# Patient Record
Sex: Female | Born: 1990 | Race: White | Hispanic: No | Marital: Married | Smoking: Current every day smoker
Health system: Southern US, Community
[De-identification: ages and names within clinical notes are randomized; demographics above are authoritative.]

## PROBLEM LIST (undated history)

## (undated) DIAGNOSIS — F419 Anxiety disorder, unspecified: Secondary | ICD-10-CM

## (undated) HISTORY — DX: Anxiety disorder, unspecified: F41.9

## (undated) HISTORY — PX: OTHER SURGICAL HISTORY: SHX169

---

## 2017-01-02 ENCOUNTER — Encounter: Payer: Self-pay | Admitting: Endocrinology

## 2017-02-23 ENCOUNTER — Encounter: Payer: Self-pay | Admitting: Endocrinology

## 2017-08-17 ENCOUNTER — Encounter: Payer: Self-pay | Admitting: Endocrinology

## 2017-08-17 ENCOUNTER — Ambulatory Visit (INDEPENDENT_AMBULATORY_CARE_PROVIDER_SITE_OTHER): Payer: BLUE CROSS/BLUE SHIELD | Admitting: Endocrinology

## 2017-08-17 DIAGNOSIS — F419 Anxiety disorder, unspecified: Secondary | ICD-10-CM

## 2017-08-17 DIAGNOSIS — R946 Abnormal results of thyroid function studies: Secondary | ICD-10-CM | POA: Diagnosis not present

## 2017-08-17 NOTE — Progress Notes (Signed)
Subjective:    Patient ID: Tammie SmilesMacy Clark, female    DOB: 08/18/1991, 26 y.o.   MRN: [id number]  HPI Pt is referred by Artis DelayAshey Vanstory, PA, for abnormal TFT.  she has never been on thyroid therapy.  she has never had XRT to the anterior neck, or thyroid surgery.  she has never had thyroid imaging.  she does not consume kelp or any other non-prescribed thyroid medication.  she has never been on amiodarone.  She has moderate heat intolerance, and assoc weight gain.  She will have weight loss surgery soon.   Past Medical History:  Diagnosis Date  . Anxiety      Social History   Socioeconomic History  . Marital status: Married    Spouse name: Not on file  . Number of children: Not on file  . Years of education: Not on file  . Highest education level: Not on file  Social Needs  . Financial resource strain: Not on file  . Food insecurity - worry: Not on file  . Food insecurity - inability: Not on file  . Transportation needs - medical: Not on file  . Transportation needs - non-medical: Not on file  Occupational History  . Not on file  Tobacco Use  . Smoking status: Never Smoker  . Smokeless tobacco: Never Used  Substance and Sexual Activity  . Alcohol use: Not on file  . Drug use: Not on file  . Sexual activity: Not on file  Other Topics Concern  . Not on file  Social History Narrative  . Not on file    Current Outpatient Medications on File Prior to Visit  Medication Sig Dispense Refill  . etonogestrel-ethinyl estradiol (NUVARING) 0.12-0.015 MG/24HR vaginal ring NuvaRing 0.12 mg -0.015 mg/24 hr vaginal    . clonazePAM (KLONOPIN) 0.5 MG tablet TK 1 T PO BID  2  . sertraline (ZOLOFT) 100 MG tablet TK 1 T PO ONCE A DAY  2   No current facility-administered medications on file prior to visit.     Not on File  Family History  Problem Relation Age of Onset  . Thyroid disease Mother   . Thyroid disease Maternal Grandmother     BP 126/90   Pulse 92   Wt 285 lb (129.3 kg)    LMP 07/21/2017   SpO2 98%    Review of Systems denies hoarseness, visual loss, palpitations, sob, polyuria, muscle weakness, tremor, and rhinorrhea.  She has fatigue, diarrhea,excessive diaphoresis, easy bruising, anxiety, edema, and headache.       Objective:   Physical Exam VS: see vs page GEN: no distress HEAD: head: no deformity eyes: no periorbital swelling, no proptosis external nose and ears are normal mouth: no lesion seen NECK: supple, thyroid is not enlarged CHEST WALL: no deformity LUNGS: clear to auscultation CV: reg rate and rhythm, no murmur ABD: abdomen is soft, nontender.  no hepatosplenomegaly.  not distended.  no hernia MUSCULOSKELETAL: muscle bulk and strength are grossly normal.  no obvious joint swelling.  gait is normal and steady.  EXTEMITIES: no deformity.  no edema PULSES: no carotid bruit NEURO:  cn 2-12 grossly intact.   readily moves all 4's.  sensation is intact to touch on all 4's SKIN:  Normal texture and temperature.  No rash or suspicious lesion is visible.   NODES:  None palpable at the neck PSYCH: alert, well-oriented.  Does not appear anxious nor depressed.    outside test results are reviewed: TSH=normal Free T3 uptake is  low FTI=normal  I have reviewed outside records, and summarized: Pt was noted to have abnormal TFT, and referred here.  Main probs addressed were anxiety, depression, and obesity.        Assessment & Plan:  Hyperthyroxinemia, new to me, due to Nuva-Ring.  She is euthyroid.   Anxiety, and other sxs, not thyroid-related.   Patient Instructions  The abnormal thyroid blood test is due to the Nuva-Ring.   No further thyroid testing or treatment is needed for the thyroid now.  From the standpoint of the thyroid, you do not have any excess surgical risk.  I would be happy to see you back here as needed

## 2017-08-17 NOTE — Patient Instructions (Addendum)
The abnormal thyroid blood test is due to the Nuva-Ring.   No further thyroid testing or treatment is needed for the thyroid now.  From the standpoint of the thyroid, you do not have any excess surgical risk.  I would be happy to see you back here as needed

## 2017-08-19 ENCOUNTER — Encounter: Payer: Self-pay | Admitting: Endocrinology

## 2017-08-19 DIAGNOSIS — F419 Anxiety disorder, unspecified: Secondary | ICD-10-CM | POA: Insufficient documentation

## 2017-08-19 DIAGNOSIS — R946 Abnormal results of thyroid function studies: Secondary | ICD-10-CM | POA: Insufficient documentation

## 2019-12-01 ENCOUNTER — Encounter (HOSPITAL_COMMUNITY): Payer: Self-pay | Admitting: Emergency Medicine

## 2019-12-01 ENCOUNTER — Emergency Department (HOSPITAL_COMMUNITY): Payer: BC Managed Care – PPO

## 2019-12-01 ENCOUNTER — Other Ambulatory Visit: Payer: Self-pay

## 2019-12-01 ENCOUNTER — Emergency Department (HOSPITAL_COMMUNITY)
Admission: EM | Admit: 2019-12-01 | Discharge: 2019-12-01 | Disposition: A | Discharge status: Home / Self Care | Payer: BC Managed Care – PPO | Attending: Emergency Medicine | Admitting: Emergency Medicine

## 2019-12-01 DIAGNOSIS — M79602 Pain in left arm: Secondary | ICD-10-CM | POA: Insufficient documentation

## 2019-12-01 DIAGNOSIS — M542 Cervicalgia: Secondary | ICD-10-CM | POA: Diagnosis not present

## 2019-12-01 DIAGNOSIS — M5412 Radiculopathy, cervical region: Secondary | ICD-10-CM

## 2019-12-01 MED ORDER — HYDROCODONE-ACETAMINOPHEN 5-325 MG PO TABS: ORAL_TABLET | ORAL | 0 refills | Status: DC

## 2019-12-01 NOTE — ED Triage Notes (Signed)
Pt reports neck pain for over 1 month, pt states she woke one month ago and her neck was locked-no injury. Pt states she had a xray at that time and place on steroids and muscle relaxer. Pt continues to have pain even though is doing physical therapy.

## 2019-12-01 NOTE — ED Notes (Signed)
Pt back from CT

## 2019-12-01 NOTE — ED Provider Notes (Signed)
PROGRESS NOTE                                                                                                                 This is a sign-out from PA lawyer at shift change: Arriana Lohmann is a 29 y.o. female presenting with cervical pain radiating down to the left hand fingertips.. Plan is to follow-up CT, discharge home with spine surgery referral and outpatient MRI.  Please refer to previous note for full HPI, ROS, PMH and PE.   CT negative, explained to patient that she is to follow-up with primary care and neurosurgery and return to ED for any new or worsening symptoms.      Kaylyn Lim 12/01/19 2024    Tegeler, Canary Brim, MD 12/01/19 [phone]

## 2019-12-01 NOTE — ED Notes (Signed)
Patient verbalizes understanding of discharge instructions, prescription medications, and follow up care. Opportunity for questioning and answers were provided. All questions answered completely.  Armband removed by staff, pt discharged from ED. Ambulatory from ED with strong, steady gait

## 2019-12-01 NOTE — Discharge Instructions (Signed)
Return here as needed.  Follow-up with the neurosurgeon provided.  You should receive a call from the MRI team to schedule the appointment.

## 2019-12-01 NOTE — ED Provider Notes (Signed)
[facility] EMERGENCY DEPARTMENT Provider Note   CSN: [id number] Arrival date & time: 12/01/19  1716     History Chief Complaint  Patient presents with  . Neck Pain    Tammie Clark is a 29 y.o. female.  HPI Patient presents to the emergency department with neck pain that started 1 month ago.  The patient states that she felt like her neck locked up on her and she went and saw her primary doctor.  She states she was placed on neck pain that started 1 month ago.  The patient states that she felt like her neck locked up on her and she went and saw her primary doctor.  She states she was placed on steroid medication and muscle relaxants.  The patient states that these initially seem to help but then she states that her pain came back worse.  The patient states that she then was started back on steroids but feels like they are making her very anxious.  Patient states that she is having some tingling in her thumb index finger and middle finger on left hand.  She states she not have any weakness in her extremities.  The patient denies chest pain, shortness of breath, headache,blurred vision,fever, cough, weakness, numbness, dizziness, anorexia, edema, abdominal pain, nausea, vomiting, diarrhea, rash, back pain, dysuria, hematemesis, bloody stool, near syncope, or syncope.  Past Medical History:  Diagnosis Date  . Anxiety     Patient Active Problem List   Diagnosis Date Noted  . Thyroid function study abnormality 08/19/2017  . Anxiety    OB History   No obstetric history on file.     Family History  Problem Relation Age of Onset  . Thyroid disease Mother   . Thyroid disease Maternal Grandmother     Social History   Tobacco Use  . Smoking status: Never Smoker  . Smokeless tobacco: Never Used  Substance Use Topics  . Alcohol use: Not on file  . Drug use: Not on file    Home Medications Prior to Admission medications   Medication Sig Start Date End Date Taking?  Authorizing Provider  clonazePAM (KLONOPIN) 0.5 MG tablet TK 1 T PO BID 07/30/17   [provider]  etonogestrel-ethinyl estradiol (NUVARING) 0.12-0.015 MG/24HR vaginal ring NuvaRing 0.12 mg -0.015 mg/24 hr vaginal 11/08/14   [provider]  sertraline (ZOLOFT) 100 MG tablet TK 1 T PO ONCE A DAY 07/30/17   [provider]    Allergies    Patient has no known allergies.  Review of Systems   Review of Systems All other systems negative except as documented in the HPI. All pertinent positives and negatives as reviewed in the HPI. Physical Exam Updated Vital Signs BP (!) 153/105 (BP Location: Left Arm)   Pulse 98   Temp 98.2 F (36.8 C) (Oral)   Resp 16   Ht 5\' 7"  (1.702 m)   Wt 71.7 kg   LMP 11/21/2019   SpO2 100%   BMI 24.75 kg/m   Physical Exam Vitals and nursing note reviewed.  Constitutional:      General: She is not in acute distress.    Appearance: She is well-developed.  HENT:     Head: Normocephalic and atraumatic.  Eyes:     Pupils: Pupils are equal, round, and reactive to light.  Cardiovascular:     Rate and Rhythm: Normal rate and regular rhythm.     Heart sounds: Normal heart sounds. No murmur. No friction rub. No  gallop.   Pulmonary:     Effort: Pulmonary effort is normal. No respiratory distress.     Breath sounds: Normal breath sounds. No wheezing.  Musculoskeletal:     Cervical back: Neck supple. Tenderness present. No swelling, deformity, signs of trauma, rigidity, spasms or bony tenderness. Pain with movement present. Decreased range of motion.  Skin:    General: Skin is warm and dry.     Capillary Refill: Capillary refill takes less than 2 seconds.     Findings: No erythema or rash.  Neurological:     Mental Status: She is alert and oriented to person, place, and time.     GCS: GCS eye subscore is 4. GCS verbal subscore is 5. GCS motor subscore is 6.     Sensory: No sensory deficit.     Motor: No weakness or abnormal muscle  tone.     Coordination: Coordination normal. Finger-Nose-Finger Test and Heel to [place] Test normal.     Gait: Gait is intact.  Psychiatric:        Behavior: Behavior normal.     ED Results / Procedures / Treatments   Labs (all labs ordered are listed, but only abnormal results are displayed) Labs Reviewed - No data to display  EKG None  Radiology No results found.  Procedures Procedures (including critical care time)  Medications Ordered in ED Medications - No data to display  ED Course  I have reviewed the triage vital signs and the nursing notes.  Pertinent labs & imaging results that were available during my care of the patient were reviewed by me and considered in my medical decision making (see chart for details).    MDM Rules/Calculators/A&P                      Patient has no weakness in her extremities on my examination.  She has normal reflexes and mild decrease sensation in the thumb index finger and middle finger on her left hand.  There is no signs of gait abnormality at this time.  At this point the patient has normal reflexes in her upper extremities well.  The patient I feel is a radiculopathy but this point do not feel she warrants MRI in the emergency department as an emergent test.  We will perform a CT scan of her neck to look for any major abnormalities that would be contributing causing this issue. Final Clinical Impression(s) / ED Diagnoses Final diagnoses:  None    Rx / DC Orders ED Discharge Orders    None       Charlestine Night, PA-C 12/01/19 1856    Tegeler, Canary Brim, MD 12/01/19 [phone]

## 2019-12-01 NOTE — ED Notes (Signed)
Imaging called for pending CT scan. Advised patient will be transported to imaging soon.

## 2019-12-04 ENCOUNTER — Emergency Department (HOSPITAL_COMMUNITY)
Admission: EM | Admit: 2019-12-04 | Discharge: 2019-12-05 | Disposition: A | Discharge status: Home / Self Care | Payer: BC Managed Care – PPO | Attending: Emergency Medicine | Admitting: Emergency Medicine

## 2019-12-04 ENCOUNTER — Encounter (HOSPITAL_COMMUNITY): Payer: Self-pay | Admitting: *Deleted

## 2019-12-04 ENCOUNTER — Other Ambulatory Visit: Payer: Self-pay

## 2019-12-04 DIAGNOSIS — Z20822 Contact with and (suspected) exposure to covid-19: Secondary | ICD-10-CM | POA: Insufficient documentation

## 2019-12-04 DIAGNOSIS — F3010 Manic episode without psychotic symptoms, unspecified: Secondary | ICD-10-CM

## 2019-12-04 DIAGNOSIS — F3112 Bipolar disorder, current episode manic without psychotic features, moderate: Secondary | ICD-10-CM | POA: Diagnosis not present

## 2019-12-04 DIAGNOSIS — F411 Generalized anxiety disorder: Secondary | ICD-10-CM | POA: Diagnosis not present

## 2019-12-04 DIAGNOSIS — R45851 Suicidal ideations: Secondary | ICD-10-CM | POA: Insufficient documentation

## 2019-12-04 LAB — I-STAT BETA HCG BLOOD, ED (MC, WL, AP ONLY): I-stat hCG, quantitative: <5 m[IU]/mL (ref <5)

## 2019-12-04 NOTE — ED Triage Notes (Signed)
The pt reports that she is here because she is suicidal  Has taken an overdose of meds to try to kill jerself

## 2019-12-05 ENCOUNTER — Encounter (HOSPITAL_COMMUNITY): Payer: Self-pay

## 2019-12-05 ENCOUNTER — Other Ambulatory Visit: Payer: Self-pay

## 2019-12-05 ENCOUNTER — Emergency Department (HOSPITAL_COMMUNITY)
Admission: EM | Admit: 2019-12-05 | Discharge: 2019-12-05 | Disposition: A | Discharge status: Home / Self Care | Payer: BC Managed Care – PPO | Source: Home / Self Care | Attending: Emergency Medicine | Admitting: Emergency Medicine

## 2019-12-05 ENCOUNTER — Encounter (HOSPITAL_COMMUNITY): Payer: Self-pay | Admitting: Emergency Medicine

## 2019-12-05 DIAGNOSIS — E860 Dehydration: Secondary | ICD-10-CM

## 2019-12-05 LAB — RESPIRATORY PANEL BY RT PCR (FLU A&B, COVID)
Influenza A by PCR: NEGATIVE
Influenza B by PCR: NEGATIVE
SARS Coronavirus 2 by RT PCR: NEGATIVE

## 2019-12-05 LAB — BASIC METABOLIC PANEL
Anion gap: 8 (ref 5–15)
BUN: 15 mg/dL (ref 6–20)
CO2: 23 mmol/L (ref 22–32)
Calcium: 8.8 mg/dL — ABNORMAL LOW (ref 8.9–10.3)
Chloride: 107 mmol/L (ref 98–111)
Creatinine, Ser: 0.73 mg/dL (ref 0.44–1.00)
GFR calc Af Amer: >60 mL/min (ref >60)
GFR calc non Af Amer: >60 mL/min (ref >60)
Glucose, Bld: 121 mg/dL — ABNORMAL HIGH (ref 70–99)
Potassium: 4.3 mmol/L (ref 3.5–5.1)
Sodium: 138 mmol/L (ref 135–145)

## 2019-12-05 LAB — COMPREHENSIVE METABOLIC PANEL
ALT: 25 U/L (ref 0–44)
AST: 20 U/L (ref 15–41)
Albumin: 3.6 g/dL (ref 3.5–5.0)
Alkaline Phosphatase: 42 U/L (ref 38–126)
Anion gap: 8 (ref 5–15)
BUN: 22 mg/dL — ABNORMAL HIGH (ref 6–20)
CO2: 26 mmol/L (ref 22–32)
Calcium: 9 mg/dL (ref 8.9–10.3)
Chloride: 106 mmol/L (ref 98–111)
Creatinine, Ser: 0.87 mg/dL (ref 0.44–1.00)
GFR calc Af Amer: >60 mL/min (ref >60)
GFR calc non Af Amer: >60 mL/min (ref >60)
Glucose, Bld: 142 mg/dL — ABNORMAL HIGH (ref 70–99)
Potassium: 4.3 mmol/L (ref 3.5–5.1)
Sodium: 140 mmol/L (ref 135–145)
Total Bilirubin: 0.4 mg/dL (ref 0.3–1.2)
Total Protein: 6.2 g/dL — ABNORMAL LOW (ref 6.5–8.1)

## 2019-12-05 LAB — CBC
HCT: 39.8 % (ref 36.0–46.0)
HCT: 40.8 % (ref 36.0–46.0)
Hemoglobin: 12.9 g/dL (ref 12.0–15.0)
Hemoglobin: 13.1 g/dL (ref 12.0–15.0)
MCH: 30 pg (ref 26.0–34.0)
MCH: 30.5 pg (ref 26.0–34.0)
MCHC: 32.1 g/dL (ref 30.0–36.0)
MCHC: 32.4 g/dL (ref 30.0–36.0)
MCV: 93.6 fL (ref 80.0–100.0)
MCV: 94.1 fL (ref 80.0–100.0)
Platelets: 223 10*3/uL (ref 150–400)
Platelets: 251 10*3/uL (ref 150–400)
RBC: 4.23 MIL/uL (ref 3.87–5.11)
RBC: 4.36 MIL/uL (ref 3.87–5.11)
RDW: 12.6 % (ref 11.5–15.5)
RDW: 12.7 % (ref 11.5–15.5)
WBC: 6.4 10*3/uL (ref 4.0–10.5)
WBC: 7 10*3/uL (ref 4.0–10.5)
nRBC: 0 % (ref 0.0–0.2)
nRBC: 0 % (ref 0.0–0.2)

## 2019-12-05 LAB — ETHANOL: Alcohol, Ethyl (B): <10 mg/dL (ref <10)

## 2019-12-05 LAB — SALICYLATE LEVEL: Salicylate Lvl: <7 mg/dL — ABNORMAL LOW (ref 7.0–30.0)

## 2019-12-05 LAB — I-STAT BETA HCG BLOOD, ED (MC, WL, AP ONLY): I-stat hCG, quantitative: <5 m[IU]/mL (ref <5)

## 2019-12-05 LAB — ACETAMINOPHEN LEVEL: Acetaminophen (Tylenol), Serum: 11 ug/mL (ref 10–30)

## 2019-12-05 MED ORDER — ONDANSETRON 4 MG PO TBDP: 4.0000 mg | ORAL_TABLET | Freq: Three times a day (TID) | ORAL | 0 refills | Status: DC | PRN

## 2019-12-05 MED ORDER — ALPRAZOLAM 0.5 MG PO TABS
0.5000 mg | ORAL_TABLET | Freq: Three times a day (TID) | ORAL | Status: DC | PRN
  Administered 2019-12-05 (×2): 0.5 mg via ORAL
  Filled 2019-12-05: qty 1
  Filled 2019-12-05: qty 2

## 2019-12-05 MED ORDER — SODIUM CHLORIDE 0.9 % IV BOLUS
1000.0000 mL | Freq: Once | INTRAVENOUS | Status: AC
  Administered 2019-12-05: 1000 mL via INTRAVENOUS

## 2019-12-05 MED ORDER — SODIUM CHLORIDE 0.9% FLUSH: 3.0000 mL | Freq: Once | INTRAVENOUS | Status: DC

## 2019-12-05 MED ORDER — SERTRALINE HCL 50 MG PO TABS
50.0000 mg | ORAL_TABLET | Freq: Every day | ORAL | Status: DC
  Filled 2019-12-05: qty 1

## 2019-12-05 MED ORDER — IBUPROFEN 400 MG PO TABS: 600.0000 mg | ORAL_TABLET | Freq: Four times a day (QID) | ORAL | Status: DC | PRN

## 2019-12-05 NOTE — BH Assessment (Signed)
Tele Assessment Note   Patient Name: Tammie Clark MRN: [id number] Referring Physician: Antonietta Breach, PA-C Location of Patient: [facility], [phone] Location of Provider: [place] is an 29 y.o. married female who presents unaccompanied to [facility] reporting suicidal ideation and severe anxiety. Pt reports she has a history of depression and anxiety. During assessment Pt appeared manic and stated that I needed to remind her or two things during the assessment: 1) "You are not God", and 2) "this is because of anxiety." She says she "played a logic game" with her husband to communicate to him she needed to come to the hospital. Pt reports she is having difficulty with her memory. She has poor concentration and repeatedly asked for questions to be repeated. She says over the past 4-5 months she has been experiencing severe anxiety. She says she has not felt depressed. She says when she came to the ED she thought she was suicidal and trying to kill herself but states she was confused and she isn't suicidal. She reports she as been sleeping three hours per night. She says she has no appetite and has lost 10 pounds in the past week. She denies homicidal ideation or history of aggressive behavior. She denies auditory or visual hallucinations. Pt reports she used to smoke marijuana regularly but stopped but today ate half a marijuana brownie.  Pt identifies neck pain as her primary stressor. She says weeks ago she began having severe pain in her neck and numbness in her left arm. She says her primary care physician, Dr Scherrie Gerlach, prescribed prednisone and hydrocodone. Her PCP also prescribes Xanax and Zoloft. Pt says she lives with her husband and they have no children. 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 Pt says today she "spent too much time with my families" and that "was a trigger for me." Pt says there is a history of addiction in her family and  taking pain medication makes her anxious. Pt reports she receives outpatient counseling with [name]. She denies any history of inpatient psychiatric treatment.  With Pt's permission, TTS called Pt's husband, Nicholas Martinique [phone], for collateral information. He says Pt has been experiencing severe pain. He says when she began taking prednisone she appeared "manic" and "hyper." He says today she returned home after visiting her parents and "wasn't making sense." He says she asked him whether the Bible said suicide was a sin and he brought her to [facility] for evaluation. He requests that the ED physician order and MRI because Pt hasn't been able to have her neck thoroughly evaluated. He says she has no history of suicide attempts.  Pt is dressed in hospital scrubs, alert and oriented x4. Pt speaks in a clear tone, at moderate volume and normal pace. She is at times tearful. Motor behavior appears restless. Eye contact is good. Pt's mood is anxious and affect is congruent with mood. Thought process is coherent at times and at other times disorganized. He concentration is poor. There is no indication Pt is currently responding to internal stimuli. Pt says she is willing to sign voluntarily into a psychiatric facility.   Diagnosis:  F41.1 Generalized anxiety disorder Rule Out: F31.12 Bipolar I disorder, Current or most recent episode manic, Moderate  Past Medical History:  Past Medical History:  Diagnosis Date  . Anxiety     Past Surgical History:  Procedure Laterality Date  . gastric sx  Family History:  Family History  Problem Relation Age of Onset  . Thyroid disease Mother   . Thyroid disease Maternal Grandmother     Social History:  reports that she has never smoked. She has never used smokeless tobacco. No history on file for alcohol and drug.  Additional Social History:  Alcohol / Drug Use Pain Medications: Denies abuse Prescriptions: Denies abuse Over the  Counter: Denies abuse History of alcohol / drug use?: Yes Longest period of sobriety (when/how long): Unknown Substance #1 Name of Substance 1: Marijuana 1 - Age of First Use: unknown 1 - Amount (size/oz): varies 1 - Frequency: Pt reports infrequent use 1 - Duration: Ongoing 1 - Last Use / Amount: 12/03/19  CIWA: CIWA-Ar BP: 134/81 Pulse Rate: 85 COWS:    Allergies: No Known Allergies  Home Medications: (Not in a hospital admission)   OB/GYN Status:  Patient's last menstrual period was 11/21/2019.  General Assessment Data Location of Assessment: [facility] ED TTS Assessment: In system Is this a Tele or Face-to-Face Assessment?: Tele Assessment Is this an Initial Assessment or a Re-assessment for this encounter?: Initial Assessment Patient Accompanied by:: N/A Language Other than English: No Living Arrangements: Other (Comment)(Lives with husband) What gender do you identify as?: Female Marital status: Married Artist name: NA Pregnancy Status: No Living Arrangements: Spouse/significant other Can pt return to current living arrangement?: Yes Admission Status: Voluntary Is patient capable of signing voluntary admission?: Yes Referral Source: Self/Family/Friend Insurance type: BCBS     Crisis Care Plan Living Arrangements: Spouse/significant other Legal Guardian: Other:(Self) Name of Psychiatrist: None (psychiatric medication by PCP) Name of Therapist: Costella Hatcher  Education Status Is patient currently in school?: No Is the patient employed, 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 : No Suicidal Intent: No Has patient had any suicidal intent within the past 6 months prior to admission? : No Is patient at risk for suicide?: No Suicidal Plan?: No Has patient had any suicidal plan within the  past 6 months prior to admission? : Yes Access to Means: Yes Specify Access to Suicidal Means: Pt has multiple medications What has been your use of drugs/alcohol within the last 12 months?: Pt reports marijuana use Previous Attempts/Gestures: No How many times?: 0 Other Self Harm Risks: None Triggers for Past Attempts: None known Intentional Self Injurious Behavior: None Family Suicide History: Unknown Recent stressful life event(s): Recent negative physical changes(Pain in neck) Persecutory voices/beliefs?: No Depression: No Depression Symptoms: Insomnia, Feeling angry/irritable Substance abuse history and/or treatment for substance abuse?: No Suicide prevention information given to non-admitted patients: Not applicable  Risk to Others within the past 6 months Homicidal Ideation: No Does patient have any lifetime risk of violence toward others beyond the six months prior to admission? : No Thoughts of Harm to Others: No Current Homicidal Intent: No Current Homicidal Plan: No Access to Homicidal Means: No Identified Victim: None History of harm to others?: No Assessment of Violence: None Noted Violent Behavior Description: Pt denies history of violence Does patient have access to weapons?: No Criminal Charges Pending?: No Does patient have a court date: No Is patient on probation?: No  Psychosis Hallucinations: None noted Delusions: None noted  Mental Status Report Appearance/Hygiene: In scrubs Eye Contact: Good Motor Activity: Restlessness Speech: Tangential Level of Consciousness: Alert Mood: Anxious Affect: Anxious Anxiety Level: Panic Attacks  Panic attack frequency: Approximately 1-2 per month Most recent panic attack: Unknown Thought Processes: Coherent Judgement: Impaired Orientation: Person, Place, Time, Situation Obsessive Compulsive Thoughts/Behaviors: None  Cognitive Functioning Concentration: Decreased Memory: Recent Intact, Remote Intact Is patient  IDD: No Insight: Fair Impulse Control: Fair Appetite: Poor Have you had any weight changes? : Loss Amount of the weight change? (lbs): 10 lbs Sleep: Decreased Total Hours of Sleep: 3 Vegetative Symptoms: None  ADLScreening [facility] Assessment Services) Patient's cognitive ability adequate to safely complete daily activities?: Yes Patient able to express need for assistance with ADLs?: Yes Independently performs ADLs?: Yes (appropriate for developmental age)  Prior Inpatient Therapy Prior Inpatient Therapy: No  Prior Outpatient Therapy Prior Outpatient Therapy: Yes Prior Therapy Dates: Current Prior Therapy [facility]ilty/Provider([facility]): [facility] Reason for Treatment: Anxiety Does patient have an ACCT team?: No Does patient have Intensive In-House Services?  : No Does patient have [facility] services? : No Does patient have P4CC services?: No  ADL Screening (condition at time of admission) Patient's cognitive ability adequate to safely complete daily activities?: Yes Is the patient deaf or have difficulty hearing?: No Does the patient have difficulty seeing, even when wearing glasses/contacts?: No Does the patient have difficulty concentrating, remembering, or making decisions?: No Patient able to express need for assistance with ADLs?: Yes Does the patient have difficulty dressing or bathing?: No Independently performs ADLs?: Yes (appropriate for developmental age) Does the patient have difficulty walking or climbing stairs?: No Weakness of Legs: None Weakness of Arms/Hands: None  Home Assistive Devices/Equipment Home Assistive Devices/Equipment: None    Abuse/Neglect Assessment (Assessment to be complete while patient is alone) Abuse/Neglect Assessment Can Be Completed: Yes Physical Abuse: Denies Verbal Abuse: Denies Sexual Abuse: Denies Exploitation of patient/patient's resources: Denies Self-Neglect: Denies     Merchant navy officer (For Healthcare) Does Patient Have a  Medical Advance Directive?: No Would patient like information on creating a medical advance directive?: No - Patient declined          Disposition: Gave clinical report to Gillermo Murdoch, NP who recommends Pt be observed and evaluated by psychiatry later this morning. Notified Antony Madura, PA-C and Joselyn Glassman, RN of recommendation.  Disposition Initial Assessment Completed for this Encounter: Yes  This service was provided via telemedicine using a 2-way, interactive audio and video technology.  Names of all persons participating in this telemedicine service and their role in this encounter. Name: Cassell Smiles Role: Patient  Name: [name] (via telephone) Role: Pt's husband  Name: Shela Commons, [facility] Role: TTS counselor    Harlin Rain Patsy [place], [facility], [facility] Triage Specialist [phone]  Pamalee Leyden 12/05/2019 3:37 AM

## 2019-12-05 NOTE — Discharge Instructions (Signed)
Recommend you follow-up with your primary doctor as well as your primary psychiatrist regarding the symptoms you are experiencing today as well as your pain.  Return to ER if you develop any recurrent thoughts of suicide, homicide, self-harm, auditory or visual hallucinations.

## 2019-12-05 NOTE — ED Notes (Signed)
Pt walked over to purple [facility] in scrubs with tech. Cooperative and calm, slightly anxious. States that she wants to speak with the [facility] people again to let them know that she is not suicidal and that she is not a risk to herself. States that she she just wants to lay down and try to get some sleep because she hasn't slept in a while due to her manic episodes. Will continue to monitor. No sitter at this time.

## 2019-12-05 NOTE — ED Provider Notes (Signed)
[facility] EMERGENCY DEPARTMENT Provider Note   CSN: [id number] Arrival date & time: 12/04/19  2307     History Chief Complaint  Patient presents with  . Psychiatric Evaluation    Tammie Clark is a 29 y.o. female.  29 year old female with a history of anxiety presents to the emergency department for psychiatric evaluation.  Initially reporting in triage that she came to the ED because she was suicidal.  She states that she no longer feels this way and last felt suicidal 4 weeks ago.  Does endorse taking tablets of Percocet for ongoing neck pain and radiculopathy around 2000 tonight.  It does appear she was prescribed Norco at a prior outpatient visit for this complaint.  No alcohol use.  She denies use of illicit substances.  No homicidal ideations.  She does feel that her anxiety has been poorly managed and feels that her physical pain is contributing to her emotional distress.  Has been encouraged to be evaluated by her husband.  Is not actively followed by a psychiatrist.  Does appear that she has been having her anxiety managed by her family medicine doctor.  Is prescribed Zoloft and Xanax.  The history is provided by the patient. No language interpreter was used.       Past Medical History:  Diagnosis Date  . Anxiety     Patient Active Problem List   Diagnosis Date Noted  . Thyroid function study abnormality 08/19/2017  . Anxiety     Past Surgical History:  Procedure Laterality Date  . gastric sx       OB History   No obstetric history on file.     Family History  Problem Relation Age of Onset  . Thyroid disease Mother   . Thyroid disease Maternal Grandmother     Social History   Tobacco Use  . Smoking status: Never Smoker  . Smokeless tobacco: Never Used  Substance Use Topics  . Alcohol use: Not on file  . Drug use: Not on file    Home Medications Prior to Admission medications   Medication Sig Start Date End Date Taking? Authorizing  Provider  clonazePAM (KLONOPIN) 0.5 MG tablet TK 1 T PO BID 07/30/17   [provider]  etonogestrel-ethinyl estradiol (NUVARING) 0.12-0.015 MG/24HR vaginal ring NuvaRing 0.12 mg -0.015 mg/24 hr vaginal 11/08/14   [provider]  HYDROcodone-acetaminophen (NORCO/VICODIN) 5-325 MG tablet Take 1-2 tablets by mouth every 6 hours as needed for pain and/or cough. 12/01/19   Pisciotta, Elmyra Ricks, PA-C  sertraline (ZOLOFT) 100 MG tablet TK 1 T PO ONCE A DAY 07/30/17   [provider]    Allergies    Amoxicillin  Review of Systems   Review of Systems  Ten systems reviewed and are negative for acute change, except as noted in the HPI.    Physical Exam Updated Vital Signs BP 135/69 (BP Location: Right Arm)   Pulse 62   Temp 98.1 F (36.7 C) (Oral)   Resp 15   Wt 71.7 kg   LMP 11/21/2019   SpO2 99%   BMI 24.76 kg/m   Physical Exam Vitals and nursing note reviewed.  Constitutional:      General: She is not in acute distress.    Appearance: She is well-developed. She is not diaphoretic.     Comments: Nontoxic appearing and in NAD  HENT:     Head: Normocephalic and atraumatic.  Eyes:     General: No scleral icterus.    Conjunctiva/sclera:  Conjunctivae normal.  Pulmonary:     Effort: Pulmonary effort is normal. No respiratory distress.     Comments: Respirations even and unlabored Musculoskeletal:        General: Normal range of motion.     Cervical back: Normal range of motion.  Skin:    General: Skin is warm and dry.     Coloration: Skin is not pale.     Findings: No erythema or rash.  Neurological:     Mental Status: She is alert and oriented to person, place, and time.  Psychiatric:        Mood and Affect: Mood is anxious. Affect is tearful.        Speech: Speech is rapid and pressured and tangential.        Behavior: Behavior is hyperactive.     Comments: Redirectable.      ED Results / Procedures / Treatments   Labs (all labs ordered are  listed, but only abnormal results are displayed) Labs Reviewed  COMPREHENSIVE METABOLIC PANEL - Abnormal; Notable for the following components:      Result Value   Glucose, Bld 142 (*)    BUN 22 (*)    Total Protein 6.2 (*)    All other components within normal limits  SALICYLATE LEVEL - Abnormal; Notable for the following components:   Salicylate Lvl <7.0 (*)    All other components within normal limits  RESPIRATORY PANEL BY RT PCR (FLU A&B, COVID)  ETHANOL  ACETAMINOPHEN LEVEL  CBC  RAPID URINE DRUG SCREEN, HOSP PERFORMED  I-STAT BETA HCG BLOOD, ED ([facility], [facility], [facility] ONLY)    EKG None  Radiology No results found.  Procedures Procedures (including critical care time)  Medications Ordered in ED Medications  ibuprofen (ADVIL) tablet 600 mg (has no administration in time range)  ALPRAZolam (XANAX) tablet 0.5 mg (0.5 mg Oral Given 12/05/19 0355)  sertraline (ZOLOFT) tablet 50 mg (has no administration in time range)    ED Course  I have reviewed the triage vital signs and the nursing notes.  Pertinent labs & imaging results that were available during my care of the patient were reviewed by me and considered in my medical decision making (see chart for details).  Clinical Course as of Dec 05 647  Mon Dec 05, 2019  8341 TTS has evaluated the patient and recommend psychiatric assessment in the morning.  Labs reviewed and patient medically cleared.   [KH]    Clinical Course User Index [KH] Darylene Price   MDM Rules/Calculators/A&P                      29 year old female presents to the emergency department for psychiatric evaluation.  She has been medically cleared and assessed by TTS who recommend psychiatric assessment in the morning.  Disposition to be determined by oncoming ED provider.   Final Clinical Impression(s) / ED Diagnoses Final diagnoses:  Manic behavior [facility])    Rx / DC Orders ED Discharge Orders    None       Antony Madura, PA-C 12/05/19 0650     Nira Conn, MD 12/05/19 [phone]

## 2019-12-05 NOTE — ED Triage Notes (Addendum)
Discharge from [facility] ED Psychiatric today. States memory loss one month ago and left arm and left leg weakness. States falls twice a week from losing balance.  Alert answering and following commands appropriate.  States "Feels like i'm going to collapse at any moment and feels dehydrated this week. States Denies SI or HI. Has neck and back pain 4/10 sore and headache constant 4/10 achy. States "has a pinched nerve in left neck". Has bilateral equal hand grasp strong move all extremities bilateral equal and strong.

## 2019-12-05 NOTE — Discharge Instructions (Signed)
Your testing is unremarkable, you will still need to follow-up for the MRI of your neck.  Please make sure you are taking an anti-inflammatory if you can such as ibuprofen up to 400 mg 3 times a day.  Take an antacid if you continue to take this as it can cause some stomach upset.  Zofran every 6 hours as needed for nausea, drink plenty of fluids, rest for the next 48 hours.  Emergency department for severe or worsening symptoms.

## 2019-12-05 NOTE — ED Notes (Signed)
Per pharmacy tech pt reported to tech that she took two Xanax

## 2019-12-05 NOTE — ED Notes (Addendum)
Pt was dressed in scrubs and wanded in triage

## 2019-12-05 NOTE — Progress Notes (Addendum)
Patient ID: Tammie Clark, female   DOB: 09-Mar-1991, 29 y.o.   MRN: [id number]   Psychiatric reassessment:  HPI: Tammie Clark is an 29 y.o. married female who presents unaccompanied to [facility] ED reporting suicidal ideation and severe anxiety. Pt reports she has a history of depression and anxiety. During assessment Pt appeared manic and stated that I needed to remind her or two things during the assessment: 1) "You are not God", and 2) "this is because of anxiety." She says she "played a logic game" with her husband to communicate to him she needed to come to the hospital. Pt reports she is having difficulty with her memory. She has poor concentration and repeatedly asked for questions to be repeated. She says over the past 4-5 months she has been experiencing severe anxiety. She says she has not felt depressed. She says when she came to the ED she thought she was suicidal and trying to kill herself but states she was confused and she isn't suicidal. She reports she as been sleeping three hours per night. She says she has no appetite and has lost 10 pounds in the past week. She denies homicidal ideation or history of aggressive behavior. She denies auditory or visual hallucinations. Pt reports she used to smoke marijuana regularly but stopped but today ate half a marijuana brownie.  Pt identifies neck pain as her primary stressor. She says weeks ago she began having severe pain in her neck and numbness in her left arm. She says her primary care physician, Dr Tammie Clark, prescribed prednisone and hydrocodone. Her PCP also prescribes Xanax and Zoloft. Pt says she lives with her husband and they have no children. 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 Pt says today she "spent too much time with my families" and that "was a trigger for me." Pt says there is a history of addiction in her family and taking pain medication makes her anxious. Pt reports she receives outpatient counseling with Tammie Clark. She denies any history of inpatient psychiatric treatment.  With Pt's permission, TTS called Pt's husband, Tammie Clark [phone], for collateral information. He says Pt has been experiencing severe pain. He says when she began taking prednisone she appeared "manic" and "hyper." He says today she returned home after visiting her parents and "wasn't making sense." He says she asked him whether the Bible said suicide was a sin and he brought her to [facility] for evaluation. He requests that the ED physician order and MRI because Pt hasn't been able to have her neck thoroughly evaluated. He says she has no history of suicide attempts.  Psychiatry evaluation: This is a 29 year old female who presented to [facility] for concerns as noted above. During this evaluation, she is alert and oriented, calm and cooperative. There are no signs of psychosis. She does not appear manic. She provides details about her reason for presenting to the ED. She reports after being prescribed prednisone by her outpatient provider following stomach surgery (1 month ago), she has felt manic with decreased sleep. She reports she has felt more anxious and to manage her anxiety, yesterday, she ate a half a marijuana brownie which," made me more manic." She reports she does not recall full details from yesterday but states that she was told by her husband that she was saying weird things and acting weird. She denies current SI with plan or intent. She denies HI or psychosis. Reports a history of self-harming behaviors when in  high school. Denies recent self harming events or suicide attempts. Denies previous inpatient psychiatric hospitalizations. She admits that she smoke marijuana regularly to deal with ongoing neck pain which is also a stressor. She denies other substance abuse or use. Reports a PMH of depression and anxiety with treatment as Xanax and Zoloft prescribed by her PCP. Reports she receives outpatient counseling with  Tammie Clark.States that she does not feel that it is necessary to go to a mental health hospital as she think the source of her mania and events that occurred yesterday was due her taking prednisone and eating the mariajuana brownie.   With verbal consent I spoke with patients husband. He stated that patient was started on prednisone and since starting the medication, she has stated that she felt hyper and manic. Reported since starting the prednisone 1 month ago, her sleep has decreased. Reported yesterday when she came home, she she started saying," strange things."  Stated," she didn't directly say she wanted to hurt herself by she referenced the bible saying," is there anything in the bible that says something about suicide." Reports that he has no knowledge of her trying to harm herself and does not believe she is a danger to herself or others. Reports there are no firearms in the home. Reports that her stressor is ongoing pain and he is hoping that she could have an MRI completed while in the hospital to figure out the source of her pain.    Disposition:  Patient no longer endorses SI. She denies any plan or intent to harm herself. She denies HI or AVH. She does not appear manic or psychotic. I spoke to patients husband who had no concerns for safety. At this time, there is no evidence of imminent risk to self or others as such, she is psychiatrically cleared. She receives outpatient therapy and it is recommended that she continues with this service. She reports a PMH of depression and anxiety and her psychotropic medications are  managed by her PCP and we dicussed the benefits of having a psychiatrist on board. She was open. Resources for a psychiatrists will be faxed over. In regard to her pain, it is recommended that she continue speaking with her PCP or other outpatient provider for ongoing evaluation and treatment.    Patient nurse Tammie Clark updated on current disposition which is psychiatric  clearance.

## 2019-12-05 NOTE — ED Triage Notes (Signed)
The pt reports that she just realized that she was not suicidal she just had a memory loss she was asleep  For approx 3 hours in the triage.  She just remembered that if I had listened when she arrived she would not still be here  For si  Which she now knows that she is not

## 2019-12-05 NOTE — ED Notes (Signed)
Pt ambulatory to the restroom without difficulty. Gait steady and even.  

## 2019-12-05 NOTE — ED Notes (Signed)
Dr Hyacinth Meeker at bedside discussing discharge instructions and followup care with patient

## 2019-12-05 NOTE — ED Provider Notes (Signed)
[facility] EMERGENCY DEPARTMENT Provider Note   CSN: [id number] Arrival date & time: 12/05/19  1105     History Chief Complaint  Patient presents with  . Weakness  . Neck Pain  . Back Pain    Tammie Clark is a 29 y.o. female.  HPI   29 year old female presenting to the hospital after recently being discharged from the psychiatric emergency department.  Reports that she is having the tail end of a manic episode after being given 2 courses of steroids for neck pain and radiculopathy symptoms.  The patient was discharged several hours ago from the psychiatric emergency department after being cleared.  She reports that she has a small stomach, she feels like she is dehydrated, she has not been able to eat and drink well, she reports that she has had very little to eat and drink, she feels like she has a dry mouth and is becoming lightheaded and does not want a fall and pass out.  She has not fallen nor has she passed out, she does not have a headache, she does not feel particular short of breath but coughs from time to time.  She reports having some chronic pain over the month in her left side neck and trapezius going down into her back as well as sending some tingling and pain down into her fingers of the left hand.  She is to follow-up with a spinal specialist and has an MRI scheduled according to the patient just told me.  She feels like the only thing that will help her today is IV fluids  Past Medical History:  Diagnosis Date  . Anxiety     Patient Active Problem List   Diagnosis Date Noted  . Thyroid function study abnormality 08/19/2017  . Anxiety     Past Surgical History:  Procedure Laterality Date  . gastric sx       OB History   No obstetric history on file.     Family History  Problem Relation Age of Onset  . Thyroid disease Mother   . Thyroid disease Maternal Grandmother     Social History   Tobacco Use  . Smoking status: Current Every Day  Smoker    Types: E-cigarettes  . Smokeless tobacco: Never Used  Substance Use Topics  . Alcohol use: Not Currently  . Drug use: Yes    Types: Marijuana    Home Medications Prior to Admission medications   Medication Sig Start Date End Date Taking? Authorizing Provider  acetaminophen (TYLENOL) 325 MG tablet Take 650 mg by mouth 2 (two) times daily as needed for mild pain.   Yes [provider]  ALPRAZolam Prudy Feeler) 0.5 MG tablet Take 0.5 mg by mouth 3 (three) times daily as needed for sleep or anxiety. 11/28/19  Yes [provider]  baclofen (LIORESAL) 10 MG tablet Take 10 mg by mouth 3 (three) times daily as needed for muscle spasms. 11/24/19  Yes [provider]  etonogestrel-ethinyl estradiol (NUVARING) 0.12-0.015 MG/24HR vaginal ring Place 1 each vaginally every 28 (twenty-eight) days.  11/08/14  Yes [provider]  HYDROcodone-acetaminophen (NORCO/VICODIN) 5-325 MG tablet Take 1-2 tablets by mouth every 6 hours as needed for pain and/or cough. 12/01/19  Yes Pisciotta, Joni Reining, PA-C  hydrocortisone 2.5 % cream Apply 1 application topically 2 (two) times daily as needed for rash. 11/15/19  Yes [provider]  Multiple Vitamin (MULTIVITAMIN WITH MINERALS) TABS tablet Take 1 tablet by mouth daily.   Yes [provider]  omeprazole (PRILOSEC) 20 MG capsule Take 1 capsule by mouth in the morning and at bedtime. 11/15/19  Yes [provider]  sertraline (ZOLOFT) 50 MG tablet Take 100 mg by mouth daily. 12/01/19  Yes [provider]  ondansetron (ZOFRAN ODT) 4 MG disintegrating tablet Take 1 tablet (4 mg total) by mouth every 8 (eight) hours as needed for nausea. 12/05/19   Eber Hong, MD    Allergies    Amoxicillin  Review of Systems   Review of Systems  All other systems reviewed and are negative.   Physical Exam Updated Vital Signs BP 125/76   Pulse 73   Temp 99.3 F (37.4 C) (Oral)   Resp 16   Ht 1.702 m (5\' 7" )    Wt 68.5 kg   LMP 11/21/2019   SpO2 100%   BMI 23.65 kg/m   Physical Exam Vitals and nursing note reviewed.  Constitutional:      General: She is not in acute distress.    Appearance: She is well-developed.  HENT:     Head: Normocephalic and atraumatic.     Mouth/Throat:     Pharynx: No oropharyngeal exudate.  Eyes:     General: No scleral icterus.       Right eye: No discharge.        Left eye: No discharge.     Conjunctiva/sclera: Conjunctivae normal.     Pupils: Pupils are equal, round, and reactive to light.  Neck:     Thyroid: No thyromegaly.     Vascular: No JVD.  Cardiovascular:     Rate and Rhythm: Normal rate and regular rhythm.     Heart sounds: Normal heart sounds. No murmur. No friction rub. No gallop.   Pulmonary:     Effort: Pulmonary effort is normal. No respiratory distress.     Breath sounds: Normal breath sounds. No wheezing or rales.  Abdominal:     General: Bowel sounds are normal. There is no distension.     Palpations: Abdomen is soft. There is no mass.     Tenderness: There is no abdominal tenderness.  Musculoskeletal:        General: No tenderness. Normal range of motion.     Cervical back: Normal range of motion and neck supple.  Lymphadenopathy:     Cervical: No cervical adenopathy.  Skin:    General: Skin is warm and dry.     Findings: No erythema or rash.  Neurological:     Mental Status: She is alert.     Coordination: Coordination normal.     Comments: Patient has normal strength and sensation to the diffuse bilateral upper extremities.  Her speech is clear, there is no facial droop  Psychiatric:     Comments: The patient is mildly agitated, she appears upset at times and thankful at times, she has some labile mood, she is not hallucinating     ED Results / Procedures / Treatments   Labs (all labs ordered are listed, but only abnormal results are displayed) Labs Reviewed  BASIC METABOLIC PANEL - Abnormal; Notable for the following  components:      Result Value   Glucose, Bld 121 (*)    Calcium 8.8 (*)    All other components within normal limits  CBC  I-STAT BETA HCG BLOOD, ED ([facility], [facility], [facility] ONLY)    EKG EKG Interpretation  Date/Time:  Monday December 05 2019 12:04:57 EDT Ventricular Rate:  82 PR Interval:  120 QRS  Duration: 76 QT Interval:  358 QTC Calculation: 418 R Axis:   82 Text Interpretation: Normal sinus rhythm Normal ECG No old tracing to compare Confirmed by Noemi Chapel [phone]) on 12/05/2019 1:43:58 PM   Radiology No results found.  Procedures Procedures (including critical care time)  Medications Ordered in ED Medications  sodium chloride 0.9 % bolus 1,000 mL (0 mLs Intravenous Stopped 12/05/19 1547)    ED Course  I have reviewed the triage vital signs and the nursing notes.  Pertinent labs & imaging results that were available during my care of the patient were reviewed by me and considered in my medical decision making (see chart for details).  Clinical Course as of Dec 07 707  Mon Dec 05, 2019  1348 Pt is now stating -"it seems the only way I can get my MRI of the neck is if I 'ass out - if I pass out will you tell the doctor?"  =- to the nurse...    [BM]    Clinical Course User Index [BM] Noemi Chapel, MD   MDM Rules/Calculators/A&P                      Overall this patient is well-appearing, she does have a psychiatric stay, she presents back within a couple of hours stating that she needs IV fluids because she feels dehydrated.  Initially the patient was seen because she was feeling manic, she had recently been on prednisone, according to the psychiatric notes the patient was back to her baseline this morning, the husband was also contacted who agreed with that as well.  We will give the patient some IV fluids, otherwise I see no reason for aggressive intervention, vital signs are unremarkable    Final Clinical Impression(s) / ED Diagnoses Final diagnoses:  Dehydration     Rx / DC Orders ED Discharge Orders         Ordered    ondansetron (ZOFRAN ODT) 4 MG disintegrating tablet  Every 8 hours PRN     12/05/19 1519           Noemi Chapel, MD 12/07/19 [phone]

## 2019-12-05 NOTE — ED Notes (Signed)
AM psych  Breakfast Ordered

## 2019-12-06 ENCOUNTER — Emergency Department (HOSPITAL_COMMUNITY)
Admission: EM | Admit: 2019-12-06 | Discharge: 2019-12-06 | Disposition: A | Discharge status: Home / Self Care | Payer: BC Managed Care – PPO | Attending: Emergency Medicine | Admitting: Emergency Medicine

## 2019-12-06 ENCOUNTER — Other Ambulatory Visit: Payer: Self-pay

## 2019-12-06 DIAGNOSIS — R569 Unspecified convulsions: Secondary | ICD-10-CM | POA: Insufficient documentation

## 2019-12-06 DIAGNOSIS — Z5321 Procedure and treatment not carried out due to patient leaving prior to being seen by health care provider: Secondary | ICD-10-CM | POA: Insufficient documentation

## 2019-12-06 LAB — BASIC METABOLIC PANEL
Anion gap: 8 (ref 5–15)
BUN: 9 mg/dL (ref 6–20)
CO2: 24 mmol/L (ref 22–32)
Calcium: 9 mg/dL (ref 8.9–10.3)
Chloride: 108 mmol/L (ref 98–111)
Creatinine, Ser: 0.78 mg/dL (ref 0.44–1.00)
GFR calc Af Amer: >60 mL/min (ref >60)
GFR calc non Af Amer: >60 mL/min (ref >60)
Glucose, Bld: 97 mg/dL (ref 70–99)
Potassium: 4 mmol/L (ref 3.5–5.1)
Sodium: 140 mmol/L (ref 135–145)

## 2019-12-06 LAB — I-STAT BETA HCG BLOOD, ED (MC, WL, AP ONLY): I-stat hCG, quantitative: <5 m[IU]/mL (ref <5)

## 2019-12-06 NOTE — ED Triage Notes (Signed)
Pt now responding to questions appropriately either "yes" or  "No" in sign language and writing on pen and paper

## 2019-12-06 NOTE — ED Triage Notes (Signed)
Pt arrives BIB [facility] for seizure-like activity. 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 No neurological deficits, no extremity weakness, able to follow commands.

## 2019-12-06 NOTE — ED Notes (Signed)
Called pt to bring back to room, no answer

## 2019-12-06 NOTE — ED Notes (Signed)
Pt called several times, no answer, pt marked LWBS

## 2020-12-31 IMAGING — CT CT CERVICAL SPINE W/O CM
3 of 4 series · 13 of 33 positions shown, 16 images · non-contrast
Comparison: None.

CLINICAL DATA: Chronic neck pain, no known injury, initial
encounter

EXAM:
CT CERVICAL SPINE WITHOUT CONTRAST
TECHNIQUE: Multidetector CT imaging of the cervical spine was performed without
intravenous contrast. Multiplanar CT image reconstructions were also
generated.

[Series 4: c_spine 2.0 st · axial · 0.33mm/px · z∈[-262,-136]mm · 5 of 95 slices shown, 7 images]
[im 16/95  soft-tissue]
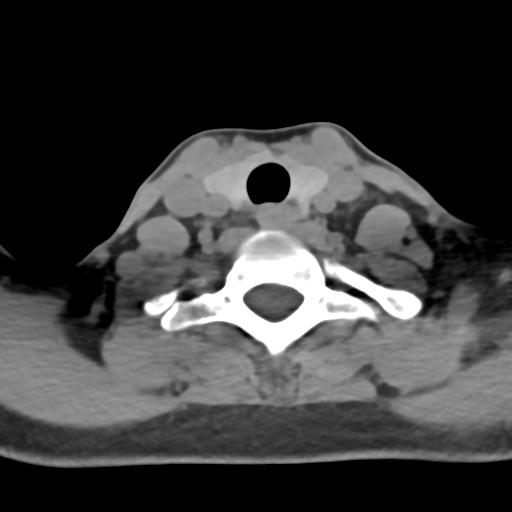
[im 16/95  bone]
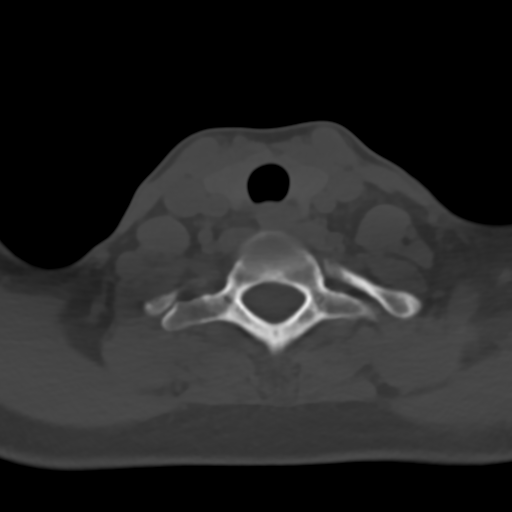
[im 32/95  bone]
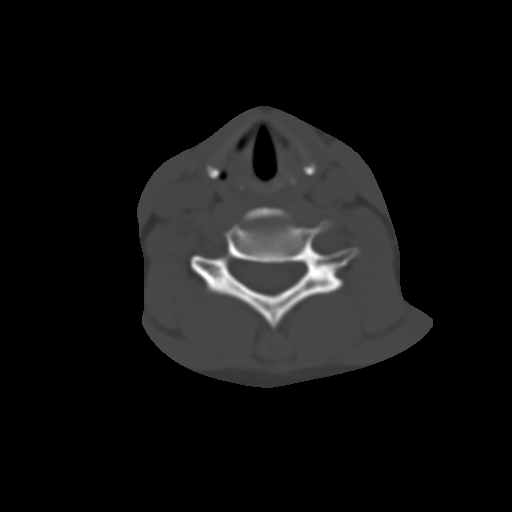
[im 48/95  bone]
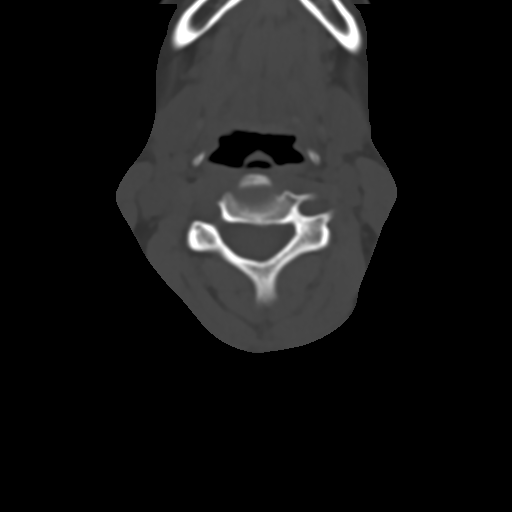
[im 63/95  bone]
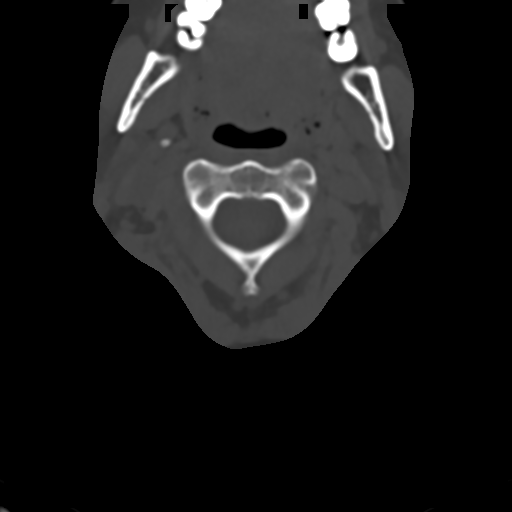
[im 79/95  soft-tissue]
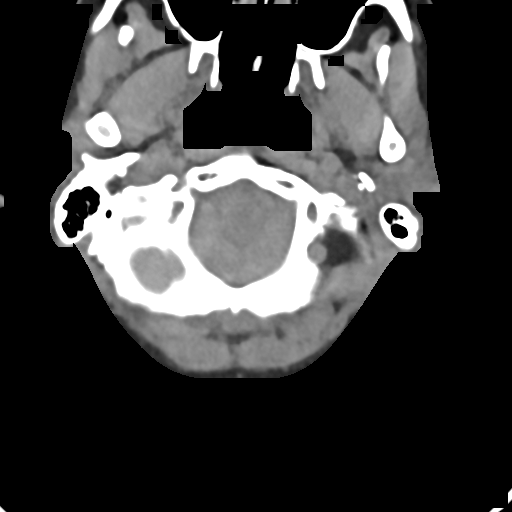
[im 79/95  bone]
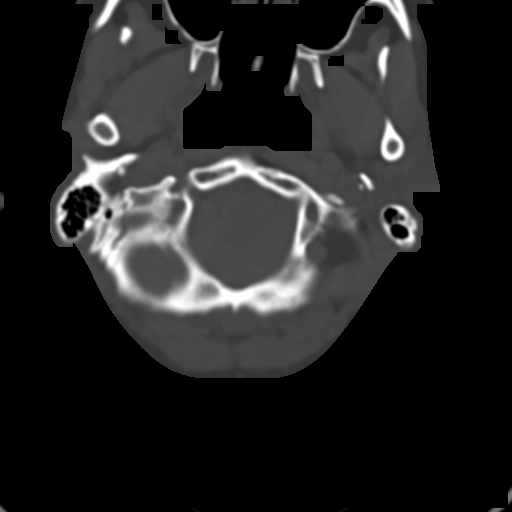

[Series 6: c_spine 2.0 sag bone · sagittal · 0.28mm/px · 5 of 61 slices shown, 6 images]
[im 21/61  bone]
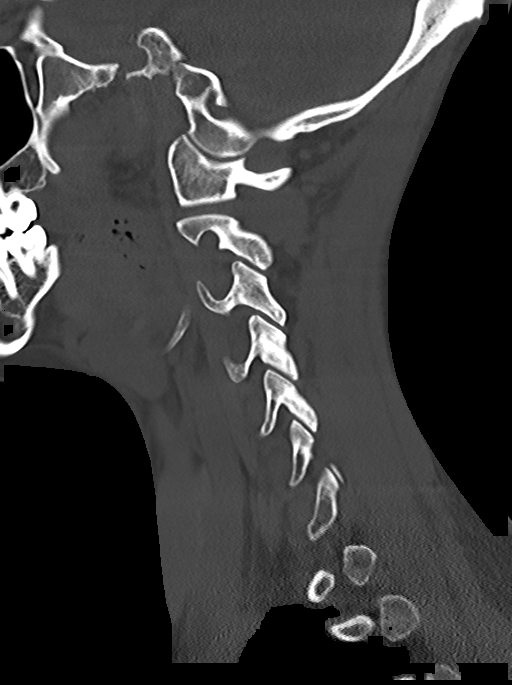
[im 26/61  bone]
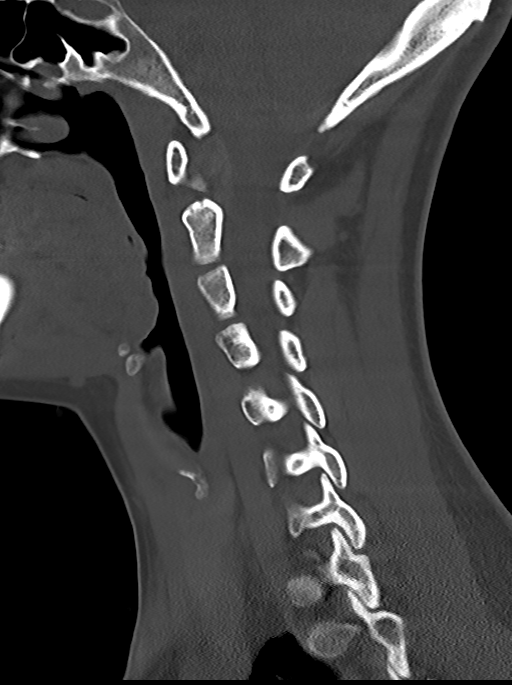
[im 31/61  soft-tissue]
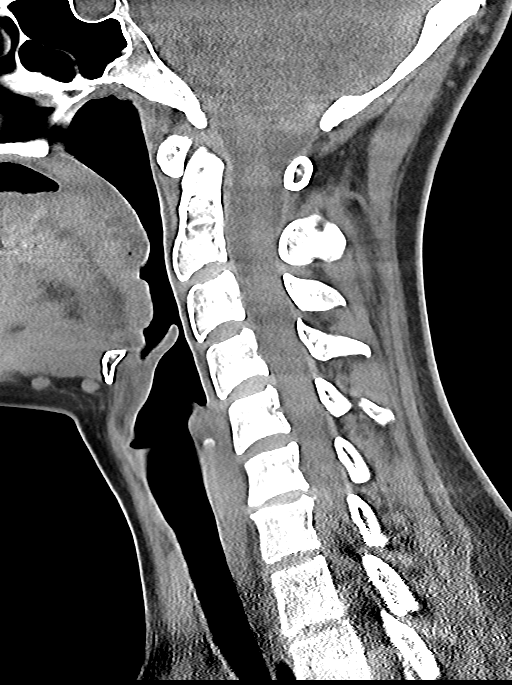
[im 31/61  bone]
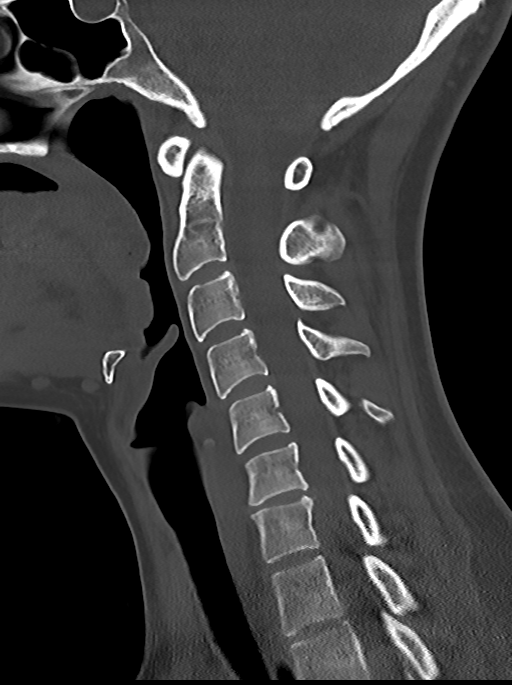
[im 36/61  bone]
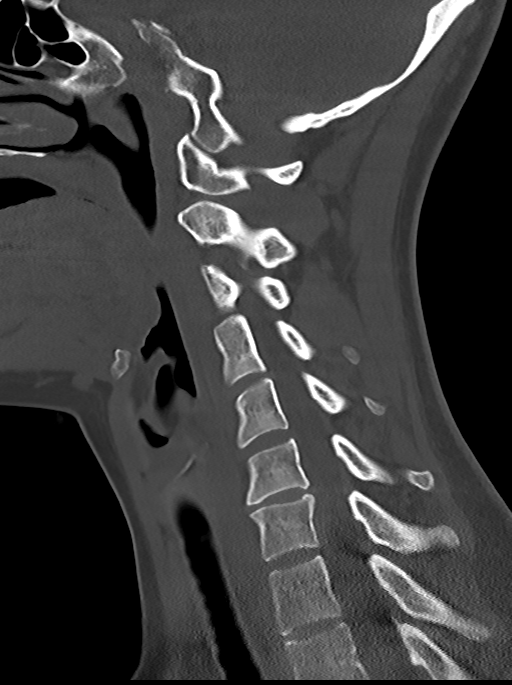
[im 41/61  bone]
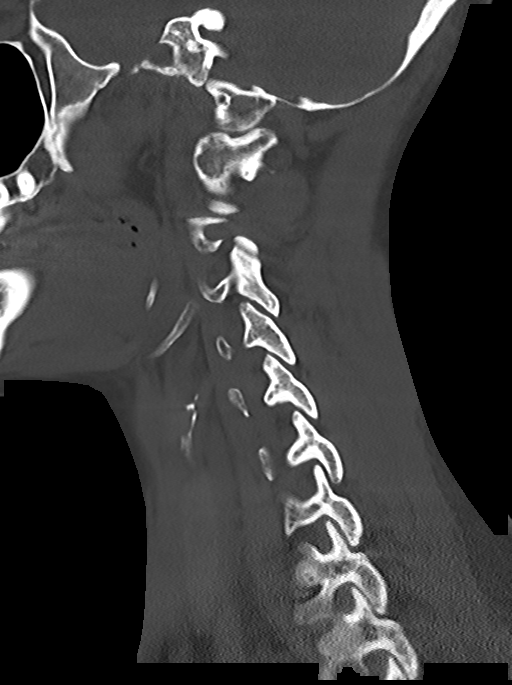

[Series 7: c_spine 2.0 cor bone · coronal · 0.28mm/px · 3 of 61 slices shown]
[im 13/61  bone]
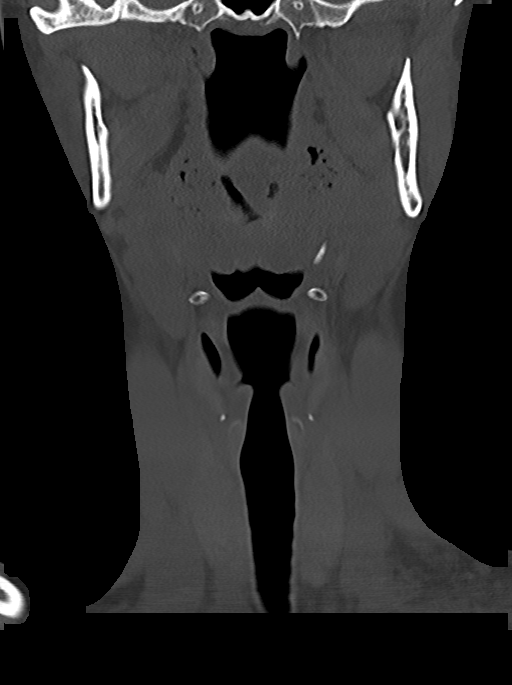
[im 25/61  bone]
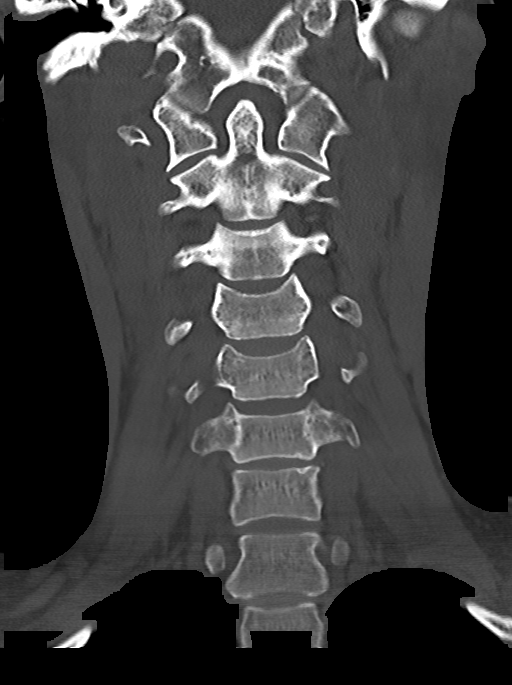
[im 37/61  bone]
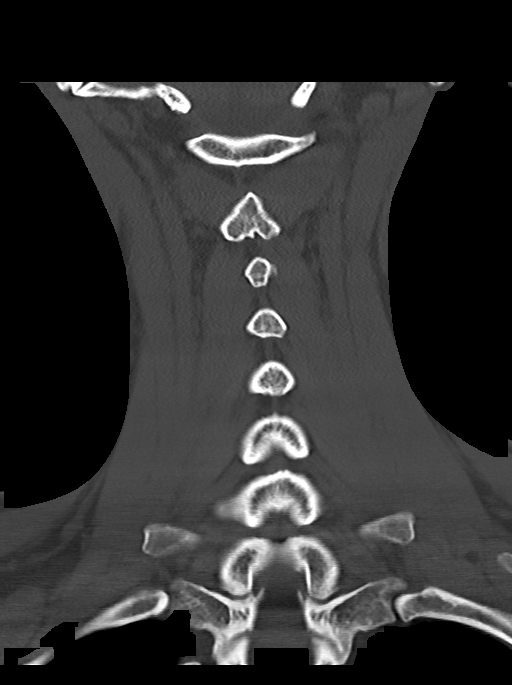

[13 of 33 positions shown; findings below may reference images not displayed]

FINDINGS: Alignment: Within normal limits.

Skull base and vertebrae: 7 cervical segments are well visualized.
Vertebral body height is well maintained. No acute fracture or acute
facet abnormality is noted. The odontoid is within normal limits.

Soft tissues and spinal canal: Surrounding soft tissue structures
are within normal limits.

Upper chest: Visualized lung apices are within normal limits.

Other: None
IMPRESSION: No acute abnormality in the cervical spine.

## 2022-09-01 NOTE — L&D Delivery Note (Addendum)
   Delivery Note:   G1P0 at [redacted]w[redacted]d  Admitting diagnosis: Normal labor [O80, Z37.9] Risks: Mood disorder on Lamictal, history of gastric bypass with AKI, normal kidney function throughout pregnancy Onset of labor: 11/30/2022 at 0940 IOL/Augmentation: AROM ROM: 11/30/2022 at 2133, clear fluid  In tub at 2108, temp 100.49F  Complete dilation at 11/30/2022  2133 Onset of pushing at 2135 FHR second stage Reassuring via Doppler, baseline 130s, no audible decels  Analgesia/Anesthesia intrapartum:Local  Pushing in hands and knees and reclining position in waterbirth tub with CNM and L&D staff support. Husband, Merrilee Seashore, sister, JC, and doula, Precious, present for birth and supportive.  Delivery of a Live born female  Birth Weight:  pending APGAR: 9, 9  Newborn Delivery   Birth date/time: 11/30/2022 22:19:00 Delivery type: Vaginal, Spontaneous     in cephalic presentation, position OA to LOA.  APGAR:1 min-9 , 5 min-9   Nuchal Cord: No  Cord double clamped after cessation of pulsation, cut by Merrilee Seashore.  Collection of cord blood for typing completed. Arterial cord blood sample-No    Placenta delivered-Spontaneous  with 3 vessels . Uterotonics: None Placenta to L&D Uterine tone firm  Bleeding scant  2nd degree  laceration identified.  Episiotomy:None  Local analgesia: Lidocaine  Repair: 3-0 in usual fashion with excellent hemostasis  Est. Blood Loss ([id number]   Complications: None  Mom to postpartum. Baby Mayer Camel to Couplet care / Skin to Skin.  Delivery Report:   Review the Delivery Report for details.    Suzan Nailer, CNM, MSN 11/30/2022, 10:49 PM

## 2022-11-27 ENCOUNTER — Inpatient Hospital Stay (HOSPITAL_COMMUNITY)
Admission: RE | Admit: 2022-11-27 | Payer: BC Managed Care – PPO | Source: Home / Self Care | Admitting: Obstetrics and Gynecology

## 2022-11-30 ENCOUNTER — Inpatient Hospital Stay (HOSPITAL_COMMUNITY)
Admission: AD | Admit: 2022-11-30 | Discharge: 2022-12-02 | DRG: 807 | Disposition: A | Discharge status: Home / Self Care | Payer: BC Managed Care – PPO | Attending: Obstetrics and Gynecology | Admitting: Obstetrics and Gynecology

## 2022-11-30 ENCOUNTER — Inpatient Hospital Stay (HOSPITAL_COMMUNITY)
Admission: AD | Admit: 2022-11-30 | Payer: BC Managed Care – PPO | Source: Home / Self Care | Admitting: Obstetrics and Gynecology

## 2022-11-30 ENCOUNTER — Encounter (HOSPITAL_COMMUNITY): Payer: Self-pay | Admitting: Obstetrics and Gynecology

## 2022-11-30 DIAGNOSIS — O99344 Other mental disorders complicating childbirth: Secondary | ICD-10-CM | POA: Diagnosis present

## 2022-11-30 DIAGNOSIS — O480 Post-term pregnancy: Secondary | ICD-10-CM | POA: Diagnosis present

## 2022-11-30 DIAGNOSIS — F39 Unspecified mood [affective] disorder: Secondary | ICD-10-CM | POA: Diagnosis present

## 2022-11-30 DIAGNOSIS — F419 Anxiety disorder, unspecified: Secondary | ICD-10-CM | POA: Diagnosis present

## 2022-11-30 DIAGNOSIS — Z7982 Long term (current) use of aspirin: Secondary | ICD-10-CM | POA: Diagnosis not present

## 2022-11-30 DIAGNOSIS — O99844 Bariatric surgery status complicating childbirth: Secondary | ICD-10-CM | POA: Diagnosis present

## 2022-11-30 DIAGNOSIS — Z3A40 40 weeks gestation of pregnancy: Secondary | ICD-10-CM | POA: Diagnosis not present

## 2022-11-30 LAB — OB RESULTS CONSOLE HIV ANTIBODY (ROUTINE TESTING): HIV: NONREACTIVE

## 2022-11-30 LAB — OB RESULTS CONSOLE GC/CHLAMYDIA
Chlamydia: NEGATIVE
Neisseria Gonorrhea: NEGATIVE

## 2022-11-30 LAB — OB RESULTS CONSOLE GBS: GBS: NEGATIVE

## 2022-11-30 LAB — OB RESULTS CONSOLE RUBELLA ANTIBODY, IGM: Rubella: IMMUNE

## 2022-11-30 LAB — OB RESULTS CONSOLE HEPATITIS B SURFACE ANTIGEN: Hepatitis B Surface Ag: NEGATIVE

## 2022-11-30 MED ORDER — OXYTOCIN 10 UNIT/ML IJ SOLN: 10.0000 [IU] | Freq: Once | INTRAMUSCULAR | Status: DC

## 2022-11-30 MED ORDER — ACETAMINOPHEN 500 MG PO TABS: 1000.0000 mg | ORAL_TABLET | Freq: Four times a day (QID) | ORAL | Status: DC | PRN

## 2022-11-30 MED ORDER — LIDOCAINE HCL (PF) 1 % IJ SOLN
30.0000 mL | INTRAMUSCULAR | Status: AC | PRN
  Administered 2022-11-30: 30 mL via SUBCUTANEOUS
  Filled 2022-11-30: qty 30

## 2022-11-30 MED ORDER — FENTANYL CITRATE (PF) 100 MCG/2ML IJ SOLN: 50.0000 ug | INTRAMUSCULAR | Status: DC | PRN

## 2022-11-30 MED ORDER — OXYTOCIN-SODIUM CHLORIDE 30-0.9 UT/500ML-% IV SOLN: 2.5000 [IU]/h | INTRAVENOUS | Status: DC

## 2022-11-30 MED ORDER — OXYTOCIN BOLUS FROM INFUSION: 333.0000 mL | Freq: Once | INTRAVENOUS | Status: DC

## 2022-11-30 MED ORDER — ONDANSETRON HCL 4 MG/2ML IJ SOLN: 4.0000 mg | Freq: Four times a day (QID) | INTRAMUSCULAR | Status: DC | PRN

## 2022-11-30 MED ORDER — SOD CITRATE-CITRIC ACID 500-334 MG/5ML PO SOLN: 30.0000 mL | ORAL | Status: DC | PRN

## 2022-11-30 NOTE — MAU Note (Signed)
.  Tammie Clark is a 32 y.o. at Unknown here in MAU reporting: ctx since 4:30 am. Closer together now and more uncomfortable. Reports some bloody show and good fetal movement .  LMP:  Onset of complaint: 4:30am Pain score: 8 There were no vitals filed for this visit.   FHT:135 Lab orders placed from triage:

## 2022-11-30 NOTE — MAU Note (Signed)
2032-2036  FHR 130bpm mod.Variability.  breaks in tracing-pt unable to allow for continuous FHR tracing standing at bedside. 2044  Tammie Clark in [place] admit orders-LD charge RN called with report per Heywood Bene RN-MAU charge. FHR remains 135bpm intermittent 2047 Pt to LD room 207 via wheelchair

## 2022-11-30 NOTE — H&P (Cosign Needed Addendum)
OB ADMISSION/ HISTORY & PHYSICAL:  Admission Date: 11/30/2022  8:24 PM  Admit Diagnosis: Normal labor [O80, Z37.9]    Tammie Clark is a 32 y.o. female G1P0 at [redacted]w[redacted]d presenting for active labor. Reports contractions every 2-3 minutes and bloody show. Denies leaking of fluid. Endorses + fetal movement. Husband, Merrilee Seashore, friend, JC, and doula, Precious, present and supportive. Eagerly anticipating a surprise baby!  Prenatal History: G1P0   EDC: 11/27/2022 Prenatal care at [facility] since 10 weeks   Primary: Dr.; Pamala Hurry, transfer to CNM care at 21 weeks for waterbirth option  Prenatal course complicated by: History of gastric bypass with AKI, followed by nephrology, seen at 36 weeks and all labs WNL. Taking bbASA daily Mood disorder, stable in Lamictal  Prenatal Labs: ABO, Rh:   A POS Antibody:   Negative Rubella:   Immune RPR:   Non-reactive HBsAg:   Negative HIV:   Negative GBS:   Negative 1 hr Glucola : GDM self screen WNL Genetic Screening: Low-risk Panorama, negative AFP-1 Ultrasound: normal anatomy, posterior placenta, last growth 16% at 35 weeks    Maternal Diabetes: No Genetic Screening: Normal Maternal Ultrasounds/Referrals: Normal Fetal Ultrasounds or other Referrals:  None Maternal Substance Abuse:  No Significant Maternal Medications:  Meds include: Other: Lamictal Significant Maternal Lab Results:  Group B Strep negative Other Comments:  None  Medical / Surgical History : Past medical history:  Past Medical History:  Diagnosis Date   Anxiety     Past surgical history:  Past Surgical History:  Procedure Laterality Date   gastric sx      Family History:  Family History  Problem Relation Age of Onset   Thyroid disease Mother    Thyroid disease Maternal Grandmother     Social History:  reports that she has been smoking e-cigarettes. She has never used smokeless tobacco. She reports that she does not currently use alcohol. She reports current drug use.  Drug: Marijuana.  Allergies: Amoxicillin   Current Medications at time of admission:  Medications Prior to Admission  Medication Sig Dispense Refill Last Dose   aspirin EC 81 MG tablet Take 81 mg by mouth daily. Swallow whole.      cholecalciferol (VITAMIN D3) 25 MCG (1000 UNIT) tablet Take 1,000 Units by mouth daily.      ferrous sulfate 325 (65 FE) MG EC tablet Take 325 mg by mouth daily.      folic acid (FOLVITE) 1 MG tablet Take 3 mg by mouth daily.      lamoTRIgine (LAMICTAL) 100 MG tablet Take 300 mg by mouth at bedtime.      Prenatal Vit-Fe Fumarate-FA (PRENATAL MULTIVITAMIN) TABS tablet Take 1 tablet by mouth daily at 12 noon.      acetaminophen (TYLENOL) 325 MG tablet Take 650 mg by mouth 2 (two) times daily as needed for mild pain.      ALPRAZolam (XANAX) 0.5 MG tablet Take 0.5 mg by mouth 3 (three) times daily as needed for sleep or anxiety. (Patient not taking: Reported on 11/30/2022)   Not Taking   baclofen (LIORESAL) 10 MG tablet Take 10 mg by mouth 3 (three) times daily as needed for muscle spasms. (Patient not taking: Reported on 11/30/2022)   Not Taking   etonogestrel-ethinyl estradiol (NUVARING) 0.12-0.015 MG/24HR vaginal ring Place 1 each vaginally every 28 (twenty-eight) days.  (Patient not taking: Reported on 11/30/2022)   Not Taking   HYDROcodone-acetaminophen (NORCO/VICODIN) 5-325 MG tablet Take 1-2 tablets by mouth every 6 hours as  needed for pain and/or cough. (Patient not taking: Reported on 11/30/2022) 11 tablet 0 Not Taking   hydrocortisone 2.5 % cream Apply 1 application topically 2 (two) times daily as needed for rash. (Patient not taking: Reported on 11/30/2022)   Not Taking   Multiple Vitamin (MULTIVITAMIN WITH MINERALS) TABS tablet Take 1 tablet by mouth daily. (Patient not taking: Reported on 11/30/2022)   Not Taking   omeprazole (PRILOSEC) 20 MG capsule Take 1 capsule by mouth in the morning and at bedtime. (Patient not taking: Reported on 11/30/2022)   Not Taking    ondansetron (ZOFRAN ODT) 4 MG disintegrating tablet Take 1 tablet (4 mg total) by mouth every 8 (eight) hours as needed for nausea. (Patient not taking: Reported on 11/30/2022) 10 tablet 0 Not Taking   sertraline (ZOLOFT) 50 MG tablet Take 100 mg by mouth daily. (Patient not taking: Reported on 11/30/2022)   Not Taking    Review of Systems: Review of Systems  All other systems reviewed and are negative.  Physical Exam: Vital signs and nursing notes reviewed.  Patient Vitals for the past 24 hrs:  BP Pulse Resp  11/30/22 2036 131/75 90 18    General: AAO x 3, NAD Heart: RRR Lungs:CTAB Abdomen: Gravid, NT Extremities: no edema SVE: Dilation: 9 Effacement (%): 90 Presentation: Vertex Exam by:: A. Ronnald Ramp.CNM   FHR: 140BPM, moderate variability, + accels, no decels TOCO: Contractions q 2-3 minutes  Labs:   No results for input(s): "WBC", "HGB", "HCT", "PLT" in the last 72 hours.  Assessment/Plan: 32 y.o. G1P0 at [redacted]w[redacted]d, active labor History of gastric bypass with AKI, seen by nephrology and labs WNL Mood disorder on Lamictal  Fetal wellbeing - FHT category 1 EFW AGA 6-7lbs  Labor: Active labor, desires waterbirth, prep tub and room for delivery  GBS negative Rubella immune Rh positive  Pain control: desires unmedicated waterbirth, class complete Analgesia/anesthesia PRN  Anticipated MOD: NSVB  Plans to breastfeed, declines circumcision. POC discussed with patient and support team, all questions answered.  Dr. Ronita Hipps notified of admission/plan of care.  Suzan Nailer CNM, MSN 11/30/2022, 8:57 PM

## 2022-12-01 ENCOUNTER — Encounter (HOSPITAL_COMMUNITY): Payer: Self-pay | Admitting: Obstetrics and Gynecology

## 2022-12-01 LAB — CBC
HCT: 35.3 % — ABNORMAL LOW (ref 36.0–46.0)
Hemoglobin: 12 g/dL (ref 12.0–15.0)
MCH: 32.3 pg (ref 26.0–34.0)
MCHC: 34 g/dL (ref 30.0–36.0)
MCV: 95.1 fL (ref 80.0–100.0)
Platelets: 207 10*3/uL (ref 150–400)
RBC: 3.71 MIL/uL — ABNORMAL LOW (ref 3.87–5.11)
RDW: 12.4 % (ref 11.5–15.5)
WBC: 18.5 10*3/uL — ABNORMAL HIGH (ref 4.0–10.5)
nRBC: 0 % (ref 0.0–0.2)

## 2022-12-01 MED ORDER — COCONUT OIL OIL: 1.0000 | TOPICAL_OIL | Status: DC | PRN

## 2022-12-01 MED ORDER — TETANUS-DIPHTH-ACELL PERTUSSIS 5-2.5-18.5 LF-MCG/0.5 IM SUSY: 0.5000 mL | PREFILLED_SYRINGE | Freq: Once | INTRAMUSCULAR | Status: DC

## 2022-12-01 MED ORDER — IBUPROFEN 600 MG PO TABS
600.0000 mg | ORAL_TABLET | Freq: Four times a day (QID) | ORAL | Status: DC
  Administered 2022-12-01 – 2022-12-02 (×6): 600 mg via ORAL
  Filled 2022-12-01 (×6): qty 1

## 2022-12-01 MED ORDER — BENZOCAINE-MENTHOL 20-0.5 % EX AERO
1.0000 | INHALATION_SPRAY | CUTANEOUS | Status: DC | PRN
  Administered 2022-12-01: 1 via TOPICAL
  Filled 2022-12-01: qty 56

## 2022-12-01 MED ORDER — LAMOTRIGINE 150 MG PO TABS
300.0000 mg | ORAL_TABLET | Freq: Every day | ORAL | Status: DC
  Administered 2022-12-01: 300 mg via ORAL
  Filled 2022-12-01 (×2): qty 2

## 2022-12-01 MED ORDER — ACETAMINOPHEN 325 MG PO TABS
650.0000 mg | ORAL_TABLET | ORAL | Status: DC | PRN
  Administered 2022-12-01 – 2022-12-02 (×5): 650 mg via ORAL
  Filled 2022-12-01 (×5): qty 2

## 2022-12-01 MED ORDER — ONDANSETRON HCL 4 MG PO TABS: 4.0000 mg | ORAL_TABLET | ORAL | Status: DC | PRN

## 2022-12-01 MED ORDER — ZOLPIDEM TARTRATE 5 MG PO TABS: 5.0000 mg | ORAL_TABLET | Freq: Every evening | ORAL | Status: DC | PRN

## 2022-12-01 MED ORDER — SIMETHICONE 80 MG PO CHEW: 80.0000 mg | CHEWABLE_TABLET | ORAL | Status: DC | PRN

## 2022-12-01 MED ORDER — ONDANSETRON HCL 4 MG/2ML IJ SOLN: 4.0000 mg | INTRAMUSCULAR | Status: DC | PRN

## 2022-12-01 MED ORDER — PRENATAL MULTIVITAMIN CH
1.0000 | ORAL_TABLET | Freq: Every day | ORAL | Status: DC
  Filled 2022-12-01: qty 1

## 2022-12-01 MED ORDER — SENNOSIDES-DOCUSATE SODIUM 8.6-50 MG PO TABS
2.0000 | ORAL_TABLET | Freq: Every day | ORAL | Status: DC
  Administered 2022-12-01 – 2022-12-02 (×2): 2 via ORAL
  Filled 2022-12-01 (×2): qty 2

## 2022-12-01 MED ORDER — DIPHENHYDRAMINE HCL 25 MG PO CAPS: 25.0000 mg | ORAL_CAPSULE | Freq: Four times a day (QID) | ORAL | Status: DC | PRN

## 2022-12-01 MED ORDER — WITCH HAZEL-GLYCERIN EX PADS: 1.0000 | MEDICATED_PAD | CUTANEOUS | Status: DC | PRN

## 2022-12-01 MED ORDER — DIBUCAINE (PERIANAL) 1 % EX OINT: 1.0000 | TOPICAL_OINTMENT | CUTANEOUS | Status: DC | PRN

## 2022-12-01 NOTE — Clinical Social Work Maternal (Signed)
CLINICAL SOCIAL WORK MATERNAL/CHILD NOTE  Patient Details  Name: Tammie Clark MRN: [id number] Date of Birth: June 12, 1991  Date:  12/01/2022  Clinical Social Worker Initiating Note:  Lorrine Kin Date/Time: Initiated:  12/01/2022  11:00am     Child's Name:  Tammie Clark   Biological Parents:  Tammie Clark and Tammie Clark   Need for Interpreter:  no   Reason for Referral:  [facility] use and Anxiety   Address:  [place] [address]    Phone number:  [phone] (home)     Additional phone number:   Household Members/Support Persons (HM/SP):   Household Member/Support Person 1   HM/SP Name Relationship DOB or Age  HM/SP -1 Tammie Clark FOB 10[birth year]1990  HM/SP -2        HM/SP -3        HM/SP -4        HM/SP -5        HM/SP -6        HM/SP -7        HM/SP -8          Natural Supports (not living in the home):  MOB's mom and friends   Professional Supports: Dr Gregary Signs Riddle-psychiatrist Lovina Reach   Employment: [employer]    Type of Work: Chartered loss adjuster   Education:  Bachelors   Homebound arranged:    Museum/gallery curator Resources:      Other Resources:      Cultural/Religious Considerations Which May Impact Care:    Strengths:  has all essential items for infant   Psychotropic Medications:  Lamictal, Trazodone, Hydroxyzine         Pediatrician:    [place]   Pediatrician List:   [facility] [place]      Pediatrician Fax Number:    Risk Factors/Current Problems:  THC and Anxiety   Cognitive State:  Able to Concentrate  , Alert  ,  Goal Oriented  , Insightful  , Linear Thinking    Mood/Affect:  Comfortable  , Relaxed    CSW Assessment: CSW received consult for THC use and anxiety. CSW met MOB at bedside to complete full psychosocial assessment. CSW entered room, introduced herself and  acknowledged FOB was present. MOB gave CSW verbal permission to speak about anything while FOB was present. CSW explained her role and the reason for the visit. MOB was polite, easy to engage, receptive to meeting with CSW, and appeared forthcoming.  CSW collected MOB's demographic information and inquired about her mental health history. 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 MOB reported since incident she is prescribed Lamictal (300mg ) and is currently tapering down,Trazodone and Hydroxyzine (25mg ) in support of symptoms. MOB is being supported by [facility] psychiatrist Dr Modesta Messing and counselor Elisabeth Pigeon at [facility].  CSW provided education regarding the baby blues period vs. perinatal mood disorders, discussed treatment and gave resources for mental health follow up if concerns arise.  CSW recommends self-evaluation during the postpartum time period using the New Mom Checklist from Postpartum Progress and encouraged MOB to contact a medical professional if symptoms are noted at any time. CSW assessed for safety with MOB SI and HI; MOB denied all. CSW did not assess for DV;  FOB was present.  CSW asked MOB does she have all needed items for infant. MOB said yes, and reported having a car seat ,bassinet and crib for safe sleeping once discharged. CSW provided review of Sudden Infant Death Syndrome (SIDS) precautions.   CSW informed MOB with the history of THC use, the hospital will perform a UDS and CDS on infant. If screenings return with a positive result a CPS report will be made; MOB was understanding. MOB reported the [facility] use was 3 years ago and did not use THC during pregnancy.  CSW Plan/Description:  No Further Intervention Required/No Barriers to Discharge, Sudden Infant Death Syndrome (SIDS) Education, Perinatal Mood and Anxiety Disorder (PMADs) Education, [place], Other Information/Referral to [employer], CSW Will Continue to Monitor Umbilical Cord Tissue and Urine Drug Screen Results and Make Report if Richardean Sale, LCSW 12/01/2022, 12:01 PM

## 2022-12-01 NOTE — Lactation Note (Signed)
This note was copied from a baby's chart. Lactation Consultation Note  Patient Name: Tammie Clark [id number] Date: 12/01/2022 Age:32 hours Reason for consult: Follow-up assessment;Primapara;1st time breastfeeding;Term (3rd LC visit - per parents baby fed at 1140 -40 mins and last fed at 1:22 to 1:52 p. 27 mins , and a wet and stool. LC reminded to call for Latch assessment. LC updated the doc flow sheets.)   Maternal Data    Feeding Mother's Current Feeding Choice: Breast Milk   Interventions Interventions: Education  Discharge Pump: Personal;DEBP  Consult Status Consult Status: Follow-up Date: 12/01/22 Follow-up type: In-patient    [place] 12/01/2022, 2:45 PM

## 2022-12-01 NOTE — Lactation Note (Signed)
This note was copied from a baby's chart. Lactation Consultation Note  Patient Name: Tammie Clark [id number] Date: 12/01/2022 Age:32 hours Reason for consult: Follow-up assessment;Primapara;1st time breastfeeding;Term 2nd Stella visit  LC checked the diaper, dry. Baby awake and LC offered to assist , mom receptive and  baby latched for a few sucks and off .  Easily hand expressed drops. LC reassured parents this is normal the 1st day of life , baby STS. Latch score 7   Maternal Data    Feeding Mother's Current Feeding Choice: Breast Milk  LATCH Score Latch: Grasps breast easily, tongue down, lips flanged, rhythmical sucking.  Audible Swallowing: None  Type of Nipple: Everted at rest and after stimulation (some areola edema)  Comfort (Breast/Nipple): Soft / non-tender  Hold (Positioning): Assistance needed to correctly position infant at breast and maintain latch.  LATCH Score: 7   Lactation Tools Discussed/Used    Interventions Interventions: Breast feeding basics reviewed;Assisted with latch;Skin to skin;Breast massage;Hand express;Reverse pressure;Breast compression;Adjust position;Support pillows;Position options  Discharge Pump: Personal (Spectra DEBP)  Consult Status Consult Status: Follow-up Date: 12/01/22 Follow-up type: In-patient    [place] 12/01/2022, 10:51 AM

## 2022-12-01 NOTE — Lactation Note (Deleted)
This note was copied from a baby's chart. Lactation Consultation Note  Patient Name: Tammie Clark [id number] Date: 12/01/2022 Age:32 hours Reason for consult: Follow-up assessment;Primapara;1st time breastfeeding;Term (3rd LC visit - per parents baby fed at 1140 -40 mins and last fed at 1:22 to 1:52 p. 27 mins , and a wet and stool. LC reminded to call for Latch assessment. LC updated the doc flow sheets.)   Maternal Data    Feeding Mother's Current Feeding Choice: Breast Milk      Interventions Interventions: Education  Discharge Pump: Personal;DEBP  Consult Status Consult Status: Follow-up Date: 12/01/22 Follow-up type: In-patient    [place] 12/01/2022, 2:46 PM

## 2022-12-01 NOTE — Lactation Note (Signed)
This note was copied from a baby's chart. Lactation Consultation Note  Patient Name: Tammie Clark Date: 12/01/2022 Age:32 hours  Room was dark but mom was awake looking at her phone. Mom stated the baby was BF really good so far. He ate 1 hr after delivery. Asked mom to call for Lactation today to see a feeding. Mom stated OK.    Maternal Data    Feeding    LATCH Score                    Lactation Tools Discussed/Used    Interventions    Discharge    Consult Status      Aleathia Purdy, Elta [place] 12/01/2022, 6:18 AM

## 2022-12-01 NOTE — Lactation Note (Signed)
This note was copied from a baby's chart. Lactation Consultation Note  Patient Name: Tammie Clark [id number] Date: 12/01/2022 Age:33 hours Reason for consult: Initial assessment;Primapara;1st time breastfeeding;Term Per mom recently attempted to latch and baby was sleepy and had been spitty.  LC reviewed the doc flow sheets and reassured parents for the baby's age is doing well , fed x 4 , wet x1 and stools x 3. Grandmother holding the baby and baby asleep.  LC reviewed 24 breast feeding goals - to feed with feeding cues and by 3 hours STS. LC recommended trying again by 10 -10:30 am.    Maternal Data    Feeding Mother's Current Feeding Choice: Breast Milk     Interventions Interventions: Breast feeding basics reviewed;Education  Discharge Pump: Personal (Spectra DEBP)  Consult Status Consult Status: Follow-up Date: 12/01/22 Follow-up type: In-patient    [place] 12/01/2022, 9:07 AM

## 2022-12-01 NOTE — Progress Notes (Signed)
   PPD #1 S/P NSVD  Live born female  Birth Weight: 6 lb 12.3 oz (3070 g) APGAR: 9, 9  Newborn Delivery   Birth date/time: 11/30/2022 22:19:00 Delivery type: Vaginal, Spontaneous     Baby name: Tammie Clark  Delivering provider: Gavin Potters K   Lacerations: 2nd degree   Circumcision: Declines  Feeding: breast  Pain control at delivery: Local   S:  Reports feeling well. Thrilled with birth experience.              Tolerating PO/No nausea or vomiting             Bleeding is light             Pain controlled with acetaminophen and ibuprofen (OTC)             Up ad lib/ambulatory/voiding without difficulties   O:  A & O x 3, in no apparent distress   Vitals:   12/01/22 0106 12/01/22 0214 12/01/22 0605 12/01/22 0950  BP: (!) 144/60 116/66 120/84 119/68  Pulse: 78 74 76 72  Resp: 17 16 18 20   Temp: 98.6 F (37 C) 98.3 F (36.8 C)  98.6 F (37 C)  TempSrc: Oral Oral  Oral  SpO2: 98% 97% 99%    Recent Labs    12/01/22 0413  WBC 18.5*  HGB 12.0  HCT 35.3*  PLT 207    Blood type:    Rubella: Immune (03/31 0000)   I&O: I/O last 3 completed shifts: In: -  Out: 76 [Blood:76]          No intake/output data recorded.  Gen: AAO x 3, NAD Abdomen: soft, non-tender, non-distended Fundus: firm, non-tender, U-1 Perineum: repair intact Lochia: small Extremities: no edema, no calf pain or tenderness   A/P:  PPD # 1 32 y.o., G1P1001  Principal Problem:   Postpartum care following vaginal delivery 3/31  Doing well - stable status  Routine post partum orders Active Problems:   Anxiety  Stable on Lamictal   Normal labor   SVD/Waterbirth   Second degree perineal laceration  Discussed perineal care and comfort measures.   Anticipate discharge tomorrow.   Suzan Nailer, MSN, CNM 12/01/2022, 11:14 AM

## 2022-12-02 NOTE — Lactation Note (Signed)
This note was copied from a baby's chart. Lactation Consultation Note  Patient Name: Tammie Clark [id number] Date: 12/02/2022 Age:32 hours Reason for consult: Follow-up assessment;Primapara;1st time breastfeeding;Infant weight loss;Breastfeeding assistance As LC entered the room baby was latched on the right breast / football/ and noted when the baby released the nipple was slanted and creased.  LC showed mom and dad tips for positioning and increasing the depth at the breast. Increased swallows with breast compressions.Latch score 9  Per mom the readjustment feels s much better.  LC reviewed and updated the doc flow sheets with mom and dad.  LC reviewed BF D/C teaching and the [facility] resources.  LC stressed the importance of STS feedings and watching the baby for non - nutritive feeding patterns.  Per mom will be seeing a Dietitian.    Maternal Data Has patient been taught Hand Expression?: Yes Does the patient have breastfeeding experience prior to this delivery?: No  Feeding Mother's Current Feeding Choice: Breast Milk  LATCH Score Latch: Grasps breast easily, tongue down, lips flanged, rhythmical sucking.  Audible Swallowing: Spontaneous and intermittent  Type of Nipple: Everted at rest and after stimulation  Comfort (Breast/Nipple): Soft / non-tender  Hold (Positioning): Assistance needed to correctly position infant at breast and maintain latch.  LATCH Score: 9   Lactation Tools Discussed/Used  Hand pump - #21 F fits the best   Interventions Interventions: Breast feeding basics reviewed;Assisted with latch;Skin to skin;Breast massage;Hand express;Breast compression;Adjust position;Support pillows;Position options;Hand pump;Education;LC Services brochure  Discharge Discharge Education: Engorgement and breast care;Warning signs for feeding baby Pump: Personal;DEBP  Consult Status Consult Status: Complete Date: 12/02/22    Myer Haff 12/02/2022, 8:28 AM

## 2022-12-02 NOTE — Discharge Summary (Signed)
OB Discharge Summary  Patient Name: Tammie Clark DOB: 03-03-1991 MRN: [id number]  Date of admission: 11/30/2022 Delivering provider: Gavin Potters K   Admitting diagnosis: Normal labor [O80, Z37.9] Intrauterine pregnancy: [redacted]w[redacted]d     Secondary diagnosis: Patient Active Problem List   Diagnosis Date Noted   Normal labor 11/30/2022   SVD/Waterbirth 11/30/2022   Second degree perineal laceration 11/30/2022   Postpartum care following vaginal delivery 3/31 11/30/2022   Anxiety     Date of discharge: 12/02/2022   Discharge diagnosis: Principal Problem:   Postpartum care following vaginal delivery 3/31 Active Problems:   Anxiety   Normal labor   SVD/Waterbirth   Second degree perineal laceration                                                            Augmentation: AROM Pain control: Local  99991111 degree  Complications: None  Hospital course:  Onset of Labor With Vaginal Delivery      32 y.o. yo G1P1001 at [redacted]w[redacted]d was admitted in Active Labor on 11/30/2022.  Membrane Rupture Time/Date: 9:33 PM ,11/30/2022   Delivery Method:Vaginal, Spontaneous  Episiotomy: None  Lacerations:  2nd degree  Patient had an uncomplicated postpartum course. She is ambulating, tolerating a regular diet, passing flatus, and urinating well. Patient is discharged home in stable condition on 12/02/22.  Newborn Data: Birth date:11/30/2022  Birth time:10:19 PM  Gender:Female  Living status:Living  Apgars:9 ,9  Weight:3070 g   Physical exam  Vitals:   12/01/22 0950 12/01/22 1449 12/01/22 2154 12/02/22 0530  BP: 119/68 103/61 107/65 109/72  Pulse: 72 88 64 60  Resp: 20 18 18 18   Temp: 98.6 F (37 C) 98.2 F (36.8 C) 97.9 F (36.6 C) 98.2 F (36.8 C)  TempSrc: Oral Oral Oral Oral  SpO2:  100% 100% 100%   General: alert and cooperative Lochia: appropriate Uterine Fundus: firm Perineum: repair intact, no edema DVT Evaluation: No evidence of DVT seen on physical exam.  Labs: Lab Results   Component Value Date   WBC 18.5 (H) 12/01/2022   HGB 12.0 12/01/2022   HCT 35.3 (L) 12/01/2022   MCV 95.1 12/01/2022   PLT 207 12/01/2022      12/01/2022    2:14 AM  Edinburgh Postnatal Depression Scale Screening Tool  I have been able to laugh and see the funny side of things. 0  I have looked forward with enjoyment to things. 0  I have blamed myself unnecessarily when things went wrong. 0  I have been anxious or worried for no good reason. 0  I have felt scared or panicky for no good reason. 1  Things have been getting on top of me. 0  I have been so unhappy that I have had difficulty sleeping. 0  I have felt sad or miserable. 0  I have been so unhappy that I have been crying. 0  The thought of harming myself has occurred to me. 0  Edinburgh Postnatal Depression Scale Total 1   Discharge instructions:  per After Visit Summary  After Visit Meds:  Allergies as of 12/02/2022       Reactions   Amoxicillin Hives        Medication List     STOP taking these medications    acetaminophen 325 MG  tablet Commonly known as: TYLENOL   ALPRAZolam 0.5 MG tablet Commonly known as: XANAX   aspirin EC 81 MG tablet   baclofen 10 MG tablet Commonly known as: LIORESAL   cholecalciferol 25 MCG (1000 UNIT) tablet Commonly known as: VITAMIN D3   ferrous sulfate 325 (65 FE) MG EC tablet   folic acid 1 MG tablet Commonly known as: FOLVITE   HYDROcodone-acetaminophen 5-325 MG tablet Commonly known as: NORCO/VICODIN   hydrocortisone 2.5 % cream   multivitamin with minerals Tabs tablet   NuvaRing 0.12-0.015 MG/24HR vaginal ring Generic drug: etonogestrel-ethinyl estradiol   omeprazole 20 MG capsule Commonly known as: PRILOSEC   ondansetron 4 MG disintegrating tablet Commonly known as: Zofran ODT   sertraline 50 MG tablet Commonly known as: ZOLOFT       TAKE these medications    LaMICtal 100 MG tablet Generic drug: lamoTRIgine Take 300 mg by mouth at bedtime.    prenatal multivitamin Tabs tablet Take 1 tablet by mouth daily at 12 noon.       Activity: Advance as tolerated. Pelvic rest for 6 weeks.   Newborn Data: Live born female  Birth Weight: 6 lb 12.3 oz (3070 g) APGAR: 9, 9  Newborn Delivery   Birth date/time: 11/30/2022 22:19:00 Delivery type: Vaginal, Spontaneous      Named Rowan Baby Feeding: Breast Circumcision: Declined Disposition:home with mother  Delivery Report:   Review the Delivery Report for details.    Follow up:  Follow-up Information     Suzan Nailer, CNM. Schedule an appointment as soon as possible for a visit in 6 week(s).   Specialty: Certified Nurse Midwife Contact information: [place] [address] [place], CNM, MSN 12/02/2022, 10:32 AM

## 2022-12-09 ENCOUNTER — Telehealth (HOSPITAL_COMMUNITY): Payer: Self-pay

## 2022-12-09 NOTE — Telephone Encounter (Signed)
Patient reports feeling good. Patient declines questions/concerns about her health and healing.  Patient reports that baby is doing good. "Breastfeeding is going well, a Advertising copywriter has been coming to the house." Baby sleeps in a bassinet. RN reviewed ABC's of safe sleep with patient. Patient declines any questions or concerns about baby.  EPDS score is 1.  Suann Larry  [facility]  Perinatal Services   12/09/22,1637
